# Patient Record
Sex: Female | Born: 1980 | Race: White | Hispanic: No | Marital: Married | Smoking: Never smoker
Health system: Southern US, Community
[De-identification: ages and names within clinical notes are randomized; demographics above are authoritative.]

## PROBLEM LIST (undated history)

## (undated) DIAGNOSIS — K589 Irritable bowel syndrome without diarrhea: Secondary | ICD-10-CM

## (undated) DIAGNOSIS — F419 Anxiety disorder, unspecified: Secondary | ICD-10-CM

## (undated) DIAGNOSIS — T7840XA Allergy, unspecified, initial encounter: Secondary | ICD-10-CM

## (undated) DIAGNOSIS — K219 Gastro-esophageal reflux disease without esophagitis: Secondary | ICD-10-CM

## (undated) DIAGNOSIS — F32A Depression, unspecified: Secondary | ICD-10-CM

## (undated) DIAGNOSIS — D649 Anemia, unspecified: Secondary | ICD-10-CM

## (undated) DIAGNOSIS — J45909 Unspecified asthma, uncomplicated: Secondary | ICD-10-CM

## (undated) DIAGNOSIS — F329 Major depressive disorder, single episode, unspecified: Secondary | ICD-10-CM

## (undated) DIAGNOSIS — Z8632 Personal history of gestational diabetes: Secondary | ICD-10-CM

## (undated) DIAGNOSIS — Z87440 Personal history of urinary (tract) infections: Secondary | ICD-10-CM

## (undated) DIAGNOSIS — B019 Varicella without complication: Secondary | ICD-10-CM

## (undated) HISTORY — DX: Varicella without complication: B01.9

## (undated) HISTORY — DX: Allergy, unspecified, initial encounter: T78.40XA

## (undated) HISTORY — DX: Irritable bowel syndrome, unspecified: K58.9

## (undated) HISTORY — DX: Gastro-esophageal reflux disease without esophagitis: K21.9

## (undated) HISTORY — DX: Personal history of urinary (tract) infections: Z87.440

## (undated) HISTORY — PX: UPPER GASTROINTESTINAL ENDOSCOPY: SHX188

## (undated) HISTORY — DX: Anemia, unspecified: D64.9

## (undated) HISTORY — DX: Unspecified asthma, uncomplicated: J45.909

## (undated) HISTORY — DX: Personal history of gestational diabetes: Z86.32

## (undated) HISTORY — DX: Depression, unspecified: F32.A

## (undated) HISTORY — DX: Anxiety disorder, unspecified: F41.9

## (undated) HISTORY — PX: COLONOSCOPY: SHX174

## (undated) HISTORY — DX: Major depressive disorder, single episode, unspecified: F32.9

---
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
## 2009-05-19 ENCOUNTER — Emergency Department (HOSPITAL_COMMUNITY): Admission: EM | Admit: 2009-05-19 | Discharge: 2009-05-19 | Payer: Self-pay | Admitting: Emergency Medicine

## 2011-06-07 ENCOUNTER — Ambulatory Visit (INDEPENDENT_AMBULATORY_CARE_PROVIDER_SITE_OTHER): Payer: BC Managed Care – PPO | Admitting: Family

## 2011-06-07 ENCOUNTER — Encounter: Payer: Self-pay | Admitting: Family

## 2011-06-07 DIAGNOSIS — R05 Cough: Secondary | ICD-10-CM

## 2011-06-07 DIAGNOSIS — J309 Allergic rhinitis, unspecified: Secondary | ICD-10-CM

## 2011-06-07 DIAGNOSIS — J45909 Unspecified asthma, uncomplicated: Secondary | ICD-10-CM

## 2011-06-07 DIAGNOSIS — R059 Cough, unspecified: Secondary | ICD-10-CM

## 2011-06-07 MED ORDER — MONTELUKAST SODIUM 10 MG PO TABS: 10.0000 mg | ORAL_TABLET | Freq: Every day | ORAL | Status: DC

## 2011-06-07 MED ORDER — ALBUTEROL SULFATE (2.5 MG/3ML) 0.083% IN NEBU: 2.5000 mg | INHALATION_SOLUTION | Freq: Four times a day (QID) | RESPIRATORY_TRACT | Status: DC | PRN

## 2011-06-07 MED ORDER — PREDNISONE 20 MG PO TABS: ORAL_TABLET | ORAL | Status: AC

## 2011-06-07 NOTE — Patient Instructions (Signed)
Asthma Attack Prevention HOW CAN ASTHMA BE PREVENTED? Currently, there is no way to prevent asthma from starting. However, you can take steps to control the disease and prevent its symptoms after you have been diagnosed. Learn about your asthma and how to control it. Take an active role to control your asthma by working with your caregiver to create and follow an asthma action plan. An asthma action plan guides you in taking your medicines properly, avoiding factors that make your asthma worse, tracking your level of asthma control, responding to worsening asthma, and seeking emergency care when needed. To track your asthma, keep records of your symptoms, check your peak flow number using a peak flow meter (handheld device that shows how well air moves out of your lungs), and get regular asthma checkups.  Other ways to prevent asthma attacks include:  Use medicines as your caregiver directs.   Identify and avoid things that make your asthma worse (as much as you can).   Keep track of your asthma symptoms and level of control.   Get regular checkups for your asthma.   With your caregiver, write a detailed plan for taking medicines and managing an asthma attack. Then be sure to follow your action plan. Asthma is an ongoing condition that needs regular monitoring and treatment.   Identify and avoid asthma triggers. A number of outdoor allergens and irritants (pollen, mold, cold air, air pollution) can trigger asthma attacks. Find out what causes or makes your asthma worse, and take steps to avoid those triggers (see below).   Monitor your breathing. Learn to recognize warning signs of an attack, such as slight coughing, wheezing or shortness of breath. However, your lung function may already decrease before you notice any signs or symptoms, so regularly measure and record your peak airflow with a home peak flow meter.   Identify and treat attacks early. If you act quickly, you're less likely to have  a severe attack. You will also need less medicine to control your symptoms. When your peak flow measurements decrease and alert you to an upcoming attack, take your medicine as instructed, and immediately stop any activity that may have triggered the attack. If your symptoms do not improve, get medical help.   Pay attention to increasing quick-relief inhaler use. If you find yourself relying on your quick-relief inhaler (such as albuterol), your asthma is not under control. See your caregiver about adjusting your treatment.  IDENTIFY AND CONTROL FACTORS THAT MAKE YOUR ASTHMA WORSE A number of common things can set off or make your asthma symptoms worse (asthma triggers). Keep track of your asthma symptoms for several weeks, detailing all the environmental and emotional factors that are linked with your asthma. When you have an asthma attack, go back to your asthma diary to see which factor, or combination of factors, might have contributed to it. Once you know what these factors are, you can take steps to control many of them.  Allergies: If you have allergies and asthma, it is important to take asthma prevention steps at home. Asthma attacks (worsening of asthma symptoms) can be triggered by allergies, which can cause temporary increased inflammation of your airways. Minimizing contact with the substance to which you are allergic will help prevent an asthma attack. Animal Dander:   Some people are allergic to the flakes of skin or dried saliva from animals with fur or feathers. Keep these pets out of your home.   If you can't keep a pet outdoors, keep the   pet out of your bedroom and other sleeping areas at all times, and keep the door closed.   Remove carpets and furniture covered with cloth from your home. If that is not possible, keep the pet away from fabric-covered furniture and carpets.  Dust Mites:  Many people with asthma are allergic to dust mites. Dust mites are tiny bugs that are found in  every home, in mattresses, pillows, carpets, fabric-covered furniture, bedcovers, clothes, stuffed toys, fabric, and other fabric-covered items.   Cover your mattress in a special dust-proof cover.   Cover your pillow in a special dust-proof cover, or wash the pillow each week in hot water. Water must be hotter than 130 F to kill dust mites. Cold or warm water used with detergent and bleach can also be effective.   Wash the sheets and blankets on your bed each week in hot water.   Try not to sleep or lie on cloth-covered cushions.   Call ahead when traveling and ask for a smoke-free hotel room. Bring your own bedding and pillows, in case the hotel only supplies feather pillows and down comforters, which may contain dust mites and cause asthma symptoms.   Remove carpets from your bedroom and those laid on concrete, if you can.   Keep stuffed toys out of the bed, or wash the toys weekly in hot water or cooler water with detergent and bleach.  Cockroaches:  Many people with asthma are allergic to the droppings and remains of cockroaches.   Keep food and garbage in closed containers. Never leave food out.   Use poison baits, traps, powders, gels, or paste (for example, boric acid).   If a spray is used to kill cockroaches, stay out of the room until the odor goes away.  Indoor Mold:  Fix leaky faucets, pipes, or other sources of water that have mold around them.   Clean moldy surfaces with a cleaner that has bleach in it.  Pollen and Outdoor Mold:  When pollen or mold spore counts are high, try to keep your windows closed.   Stay indoors with windows closed from late morning to afternoon, if you can. Pollen and some mold spore counts are highest at that time.   Ask your caregiver whether you need to take or increase anti-inflammatory medicine before your allergy season starts.  Irritants:   Tobacco smoke is an irritant. If you smoke, ask your caregiver how you can quit. Ask family  members to quit smoking, too. Do not allow smoking in your home or car.   If possible, do not use a wood-burning stove, kerosene heater, or fireplace. Minimize exposure to all sources of smoke, including incense, candles, fires, and fireworks.   Try to stay away from strong odors and sprays, such as perfume, talcum powder, hair spray, and paints.   Decrease humidity in your home and use an indoor air cleaning device. Reduce indoor humidity to below 60 percent. Dehumidifiers or central air conditioners can do this.   Try to have someone else vacuum for you once or twice a week, if you can. Stay out of rooms while they are being vacuumed and for a short while afterward.   If you vacuum, use a dust mask from a hardware store, a double-layered or microfilter vacuum cleaner bag, or a vacuum cleaner with a HEPA filter.   Sulfites in foods and beverages can be irritants. Do not drink beer or wine, or eat dried fruit, processed potatoes, or shrimp if they cause asthma   symptoms.   Cold air can trigger an asthma attack. Cover your nose and mouth with a scarf on cold or windy days.   Several health conditions can make asthma more difficult to manage, including runny nose, sinus infections, reflux disease, psychological stress, and sleep apnea. Your caregiver will treat these conditions, as well.   Avoid close contact with people who have a cold or the flu, since your asthma symptoms may get worse if you catch the infection from them. Wash your hands thoroughly after touching items that may have been handled by people with a respiratory infection.   Get a flu shot every year to protect against the flu virus, which often makes asthma worse for days or weeks. Also get a pneumonia shot once every five to 10 years.  Drugs:  Aspirin and other painkillers can cause asthma attacks. 10% to 20% of people with asthma have sensitivity to aspirin or a group of painkillers called non-steroidal anti-inflammatory drugs  (NSAIDS), such as ibuprofen and naproxen. These drugs are used to treat pain and reduce fevers. Asthma attacks caused by any of these medicines can be severe and even fatal. These drugs must be avoided in people who have known aspirin sensitive asthma. Products with acetaminophen are considered safe for people who have asthma. It is important that people with aspirin sensitivity read labels of all over-the-counter drugs used to treat pain, colds, coughs, and fever.   Beta blockers and ACE inhibitors are other drugs which you should discuss with your caregiver, in relation to your asthma.  ALLERGY SKIN TESTING  Ask your asthma caregiver about allergy skin testing or blood testing (RAST test) to identify the allergens to which you are sensitive. If you are found to have allergies, allergy shots (immunotherapy) for asthma may help prevent future allergies and asthma. With allergy shots, small doses of allergens (substances to which you are allergic) are injected under your skin on a regular schedule. Over a period of time, your body may become used to the allergen and less responsive with asthma symptoms. You can also take measures to minimize your exposure to those allergens. EXERCISE  If you have exercise-induced asthma, or are planning vigorous exercise, or exercise in cold, humid, or dry environments, prevent exercise-induced asthma by following your caregiver's advice regarding asthma treatment before exercising. Document Released: 12/27/2008 Document Revised: 12/28/2010 Document Reviewed: 12/27/2008 [employer] Patient Information 2012 [employer]. 

## 2011-06-07 NOTE — Progress Notes (Signed)
  Subjective:    Patient ID: Anne Humphrey, female    DOB: 12/24/1980, 31 y.o.   MRN: [id number]  Asthma She complains of cough, shortness of breath and wheezing. This is a recurrent problem. The current episode started yesterday. The cough is productive of sputum and productive. Associated symptoms include postnasal drip, rhinorrhea and sneezing. Her symptoms are aggravated by pollen and URI. Her symptoms are alleviated by nothing. Ineffective treatments: Out of asthma meds. Risk factors for lung disease include no known risk factors. Her past medical history is significant for asthma.      Review of Systems  Constitutional: Negative.   HENT: Positive for congestion, rhinorrhea, sneezing and postnasal drip.   Eyes: Negative.   Respiratory: Positive for cough, shortness of breath and wheezing.   Cardiovascular: Negative.   Musculoskeletal: Negative.   Skin: Negative.   Neurological: Negative.   Hematological: Negative.   Psychiatric/Behavioral: Negative.        Objective:   Physical Exam  Constitutional: She is oriented to person, place, and time. She appears well-developed and well-nourished.  HENT:  Right Ear: External ear normal.  Left Ear: External ear normal.  Nose: Nose normal.  Mouth/Throat: Oropharynx is clear and moist.  Neck: Normal range of motion. Neck supple.  Cardiovascular: Normal rate, regular rhythm and normal heart sounds.   Pulmonary/Chest: She has wheezes.  Neurological: She is alert and oriented to person, place, and time.  Skin: Skin is warm and dry.  Psychiatric: She has a normal mood and affect.          Assessment & Plan:  Assessment: Asthma Exacerbation, Cough  Plan: Singulair 10mg  QD, Proventil 2 puffs q 4-6 hours, Prednisone 60 x 3, 40 x 3, 20mg  x3. Call the office if symptoms worsen or persist. Scheduled CPX.

## 2011-06-08 ENCOUNTER — Telehealth: Payer: Self-pay

## 2011-06-08 MED ORDER — ALBUTEROL SULFATE HFA 108 (90 BASE) MCG/ACT IN AERS: 2.0000 | INHALATION_SPRAY | Freq: Four times a day (QID) | RESPIRATORY_TRACT | Status: DC | PRN

## 2011-06-08 NOTE — Telephone Encounter (Signed)
Rx sent 

## 2011-06-08 NOTE — Telephone Encounter (Signed)
Patient called stating that she was seen yesterday and a rx for a rescue inhaler was to be sent to the pharmacy. Patient would like rx sent to [facility] [address]  Please advise

## 2011-06-20 ENCOUNTER — Other Ambulatory Visit (INDEPENDENT_AMBULATORY_CARE_PROVIDER_SITE_OTHER): Payer: BC Managed Care – PPO

## 2011-06-20 ENCOUNTER — Other Ambulatory Visit: Payer: Self-pay

## 2011-06-20 ENCOUNTER — Other Ambulatory Visit: Payer: BC Managed Care – PPO

## 2011-06-20 DIAGNOSIS — Z0000 Encounter for general adult medical examination without abnormal findings: Secondary | ICD-10-CM

## 2011-06-20 LAB — TSH: TSH: 0.94 u[IU]/mL (ref 0.35–5.50)

## 2011-06-20 LAB — LIPID PANEL
Cholesterol: 178 mg/dL (ref 0–200)
LDL Cholesterol: 107 mg/dL — ABNORMAL HIGH (ref 0–99)
VLDL: 9.6 mg/dL (ref 0.0–40.0)

## 2011-06-20 LAB — CBC
Hemoglobin: 13.4 g/dL (ref 12.0–15.0)
MCHC: 33.2 g/dL (ref 30.0–36.0)
RDW: 12.9 % (ref 11.5–14.6)

## 2011-06-20 LAB — BASIC METABOLIC PANEL
Chloride: 107 mEq/L (ref 96–112)
Creatinine, Ser: 0.6 mg/dL (ref 0.4–1.2)
Potassium: 4 mEq/L (ref 3.5–5.1)

## 2011-06-25 ENCOUNTER — Encounter: Payer: BC Managed Care – PPO | Admitting: Family

## 2011-06-27 ENCOUNTER — Encounter: Payer: Self-pay | Admitting: Family

## 2011-06-27 ENCOUNTER — Ambulatory Visit (INDEPENDENT_AMBULATORY_CARE_PROVIDER_SITE_OTHER): Payer: BC Managed Care – PPO | Admitting: Family

## 2011-06-27 VITALS — BP 112/70 | Temp 98.6°F | Ht 64.0 in | Wt 127.0 lb

## 2011-06-27 DIAGNOSIS — Z0000 Encounter for general adult medical examination without abnormal findings: Secondary | ICD-10-CM

## 2011-06-27 DIAGNOSIS — Z23 Encounter for immunization: Secondary | ICD-10-CM

## 2011-06-27 NOTE — Progress Notes (Signed)
Subjective:    Patient ID: Anne Humphrey, female    DOB: [birth year], 31 y.o.   MRN: [id number]  HPI  This is a routine physical examination for this healthy  Female. Reviewed all health maintenance protocols including mammography colonoscopy bone density and reviewed appropriate screening labs. Her immunization history was reviewed as well as her current medications and allergies refills of her chronic medications were given and the plan for yearly health maintenance was discussed all orders and referrals were made as appropriate.   Review of Systems  Constitutional: Negative.   HENT: Negative.   Eyes: Negative.   Respiratory: Negative.   Cardiovascular: Negative.   Gastrointestinal: Negative.   Genitourinary: Negative.   Musculoskeletal: Negative.   Skin: Negative.   Neurological: Negative.   Hematological: Negative.   Psychiatric/Behavioral: Negative.    Past Medical History  Diagnosis Date  . Allergy   . Asthma   . GERD (gastroesophageal reflux disease)     History   Social History  . Marital Status: Married    Spouse Name: N/A    Number of Children: N/A  . Years of Education: N/A   Occupational History  . Not on file.   Social History Main Topics  . Smoking status: Never Smoker   . Smokeless tobacco: Not on file  . Alcohol Use: Yes     rarely  . Drug Use: No  . Sexually Active:    Other Topics Concern  . Not on file   Social History Narrative  . No narrative on file    Past Surgical History  Procedure Date  . Neck mass excision 1989    Family History  Problem Relation Age of Onset  . Leukemia Maternal Grandmother   . Heart disease Maternal Grandfather   . Diabetes Maternal Grandfather     Not on File  Current Outpatient Prescriptions on File Prior to Visit  Medication Sig Dispense Refill  . albuterol (PROVENTIL HFA;VENTOLIN HFA) 108 (90 BASE) MCG/ACT inhaler Inhale 2 puffs into the lungs every 6 (six) hours as needed for wheezing.  1 Inhaler   1  . albuterol (PROVENTIL) (2.5 MG/3ML) 0.083% nebulizer solution Take 3 mLs (2.5 mg total) by nebulization every 6 (six) hours as needed for wheezing.  150 mL  1  . montelukast (SINGULAIR) 10 MG tablet Take 1 tablet (10 mg total) by mouth at bedtime.  30 tablet  3    BP 112/70  Temp(Src) 98.6 F (37 C) (Oral)  Ht 5\' 4"  (1.626 m)  Wt 127 lb (57.607 kg)  BMI 21.80 kg/m2  LMP 04/28/2013chart    Objective:   Physical Exam  Constitutional: She is oriented to person, place, and time. She appears well-developed and well-nourished.  HENT:  Head: Normocephalic and atraumatic.  Right Ear: External ear normal.  Left Ear: External ear normal.  Nose: Nose normal.  Mouth/Throat: Oropharynx is clear and moist.  Eyes: Conjunctivae and EOM are normal. Pupils are equal, round, and reactive to light.  Neck: Normal range of motion. Neck supple. No thyromegaly present.  Cardiovascular: Normal rate, regular rhythm, normal heart sounds and intact distal pulses.   Pulmonary/Chest: Effort normal and breath sounds normal.  Abdominal: Soft. Bowel sounds are normal.  Genitourinary:       Deferred at patient's request  Musculoskeletal: Normal range of motion.  Neurological: She is alert and oriented to person, place, and time. She has normal reflexes.  Skin: Skin is warm and dry.  Psychiatric: She has a normal mood and  affect.          Assessment & Plan:  Assessment: Complete physical exam  Plan: Patient will return for Pap smear at a later date. Labs were discussed today. Encouraged healthy diet and exercise, self breast exams. She Administered. Continue current medications.

## 2011-06-27 NOTE — Patient Instructions (Signed)
Exercise to Stay Healthy  Exercise helps you become and stay healthy.    EXERCISE IDEAS AND TIPS  Choose exercises that:   You enjoy.   Fit into your day.  You do not need to exercise really hard to be healthy. You can do exercises at a slow or medium level and stay healthy. You can:   Stretch before and after working out.   Try yoga, Pilates, or tai chi.   Lift weights.   Walk fast, swim, jog, run, climb stairs, bicycle, dance, or rollerskate.   Take aerobic classes.    Exercises that burn about 150 calories:     Running 1  miles in 15 minutes.   Playing volleyball for 45 to 60 minutes.   Washing and waxing a car for 45 to 60 minutes.   Playing touch football for 45 minutes.   Walking 1  miles in 35 minutes.   Pushing a stroller 1  miles in 30 minutes.   Playing basketball for 30 minutes.   Raking leaves for 30 minutes.   Bicycling 5 miles in 30 minutes.   Walking 2 miles in 30 minutes.   Dancing for 30 minutes.   Shoveling snow for 15 minutes.   Swimming laps for 20 minutes.   Walking up stairs for 15 minutes.   Bicycling 4 miles in 15 minutes.   Gardening for 30 to 45 minutes.   Jumping rope for 15 minutes.   Washing windows or floors for 45 to 60 minutes.  Document Released: 02/10/2010 Document Revised: 12/28/2010 Document Reviewed: 02/10/2010  [employer] Patient Information 2012 [employer].

## 2012-01-23 NOTE — L&D Delivery Note (Addendum)
Delivery Note At 9:52 AM a viable and healthy female was delivered via  (Presentation: Direct; Occiput Anterior ).  APGAR: , ; weight Pending.   Placenta status: Spontaneous, Intact.  Cord:  with a subjectively short umbilical cord  Pt presented to L&D with painful contractions. She progressed quickly to a bulging bag.  Amniotomy was performed for thin meconium fluid. Quickly after amniotomy the patient delivered the infants head. With delivery of the head she involuntarily was closing her legs precluding immediate delivery of the shoulders. Within 30-60 seconds of delivery of the head the shoulders were delivered. No Shoulder dystocia interventions were required. The infant was placed in the bed between the maternal legs due to the short length of the umbilical cord. The cord was clamped and cut and the infant was passed to the nurse and the isolette.  The placenta was delivered spontaneously, intact, and will be sent to pathology.  No lacerations required repair.  After delivery, the infant has some syndromic facial features.  Pt declined genetic screeing during pregnancy. There NBN is aware. The infant was transferred to the NBN due to tachypnea and low oxygen saturations.  Anesthesia:  None Episiotomy: None Lacerations: None Suture Repair: None Est. Blood Loss (mL): 250cc  Mom to postpartum.  Baby to nursery-stable.  Hamlin Devine H. 08/30/2012, 10:22 AM

## 2012-04-15 LAB — OB RESULTS CONSOLE HIV ANTIBODY (ROUTINE TESTING): HIV: NONREACTIVE

## 2012-04-15 LAB — OB RESULTS CONSOLE GC/CHLAMYDIA: Chlamydia: NEGATIVE

## 2012-06-03 LAB — OB RESULTS CONSOLE HEPATITIS B SURFACE ANTIGEN: Hepatitis B Surface Ag: NEGATIVE

## 2012-06-04 LAB — OB RESULTS CONSOLE RUBELLA ANTIBODY, IGM: Rubella: IMMUNE

## 2012-06-04 LAB — OB RESULTS CONSOLE ANTIBODY SCREEN: Antibody Screen: NEGATIVE

## 2012-06-04 LAB — OB RESULTS CONSOLE ABO/RH

## 2012-08-30 ENCOUNTER — Encounter (HOSPITAL_COMMUNITY): Payer: Self-pay | Admitting: *Deleted

## 2012-08-30 ENCOUNTER — Inpatient Hospital Stay (HOSPITAL_COMMUNITY)
Admission: AD | Admit: 2012-08-30 | Discharge: 2012-09-01 | DRG: 775 | Disposition: A | Discharge status: Home / Self Care | Payer: Medicaid Other | Source: Ambulatory Visit | Attending: Obstetrics and Gynecology | Admitting: Obstetrics and Gynecology

## 2012-08-30 ENCOUNTER — Encounter (HOSPITAL_COMMUNITY): Payer: Self-pay | Admitting: Anesthesiology

## 2012-08-30 ENCOUNTER — Inpatient Hospital Stay (HOSPITAL_COMMUNITY): Payer: Medicaid Other | Admitting: Anesthesiology

## 2012-08-30 LAB — CBC
MCH: 25.6 pg — ABNORMAL LOW (ref 26.0–34.0)
MCHC: 33.1 g/dL (ref 30.0–36.0)
MCV: 77.4 fL — ABNORMAL LOW (ref 78.0–100.0)
Platelets: 233 10*3/uL (ref 150–400)
RBC: 4.02 MIL/uL (ref 3.87–5.11)

## 2012-08-30 MED ORDER — LACTATED RINGERS IV SOLN: 500.0000 mL | Freq: Once | INTRAVENOUS | Status: DC

## 2012-08-30 MED ORDER — WITCH HAZEL-GLYCERIN EX PADS
1.0000 "application " | MEDICATED_PAD | CUTANEOUS | Status: DC | PRN
  Administered 2012-08-30: 1 via TOPICAL

## 2012-08-30 MED ORDER — ONDANSETRON HCL 4 MG/2ML IJ SOLN: 4.0000 mg | INTRAMUSCULAR | Status: DC | PRN

## 2012-08-30 MED ORDER — BENZOCAINE-MENTHOL 20-0.5 % EX AERO
1.0000 "application " | INHALATION_SPRAY | CUTANEOUS | Status: DC | PRN
  Administered 2012-08-30 – 2012-08-31 (×2): 1 via TOPICAL
  Filled 2012-08-30 (×2): qty 56

## 2012-08-30 MED ORDER — OXYTOCIN 40 UNITS IN LACTATED RINGERS INFUSION - SIMPLE MED: 62.5000 mL/h | INTRAVENOUS | Status: DC

## 2012-08-30 MED ORDER — EPHEDRINE 5 MG/ML INJ
10.0000 mg | INTRAVENOUS | Status: DC | PRN
  Filled 2012-08-30: qty 2

## 2012-08-30 MED ORDER — IBUPROFEN 600 MG PO TABS
600.0000 mg | ORAL_TABLET | Freq: Four times a day (QID) | ORAL | Status: DC
  Administered 2012-08-30 – 2012-09-01 (×9): 600 mg via ORAL
  Filled 2012-08-30 (×9): qty 1

## 2012-08-30 MED ORDER — ALBUTEROL SULFATE HFA 108 (90 BASE) MCG/ACT IN AERS: 2.0000 | INHALATION_SPRAY | Freq: Four times a day (QID) | RESPIRATORY_TRACT | Status: DC | PRN

## 2012-08-30 MED ORDER — PNEUMOCOCCAL VAC POLYVALENT 25 MCG/0.5ML IJ INJ
0.5000 mL | INJECTION | INTRAMUSCULAR | Status: AC
  Administered 2012-08-31: 0.5 mL via INTRAMUSCULAR
  Filled 2012-08-30: qty 0.5

## 2012-08-30 MED ORDER — OXYTOCIN BOLUS FROM INFUSION: 500.0000 mL | INTRAVENOUS | Status: DC

## 2012-08-30 MED ORDER — DIPHENHYDRAMINE HCL 50 MG/ML IJ SOLN: 12.5000 mg | INTRAMUSCULAR | Status: DC | PRN

## 2012-08-30 MED ORDER — OXYCODONE-ACETAMINOPHEN 5-325 MG PO TABS
1.0000 | ORAL_TABLET | ORAL | Status: DC | PRN
  Administered 2012-08-30 – 2012-08-31 (×3): 1 via ORAL
  Filled 2012-08-30 (×3): qty 1

## 2012-08-30 MED ORDER — PHENYLEPHRINE 40 MCG/ML (10ML) SYRINGE FOR IV PUSH (FOR BLOOD PRESSURE SUPPORT)
80.0000 ug | PREFILLED_SYRINGE | INTRAVENOUS | Status: DC | PRN
  Filled 2012-08-30: qty 2

## 2012-08-30 MED ORDER — OXYTOCIN 40 UNITS IN LACTATED RINGERS INFUSION - SIMPLE MED
INTRAVENOUS | Status: AC
  Filled 2012-08-30: qty 1000

## 2012-08-30 MED ORDER — EPHEDRINE 5 MG/ML INJ
10.0000 mg | INTRAVENOUS | Status: DC | PRN
  Filled 2012-08-30: qty 2
  Filled 2012-08-30: qty 4

## 2012-08-30 MED ORDER — LACTATED RINGERS IV SOLN: 500.0000 mL | INTRAVENOUS | Status: DC | PRN

## 2012-08-30 MED ORDER — IBUPROFEN 600 MG PO TABS: 600.0000 mg | ORAL_TABLET | Freq: Four times a day (QID) | ORAL | Status: DC | PRN

## 2012-08-30 MED ORDER — FLEET ENEMA 7-19 GM/118ML RE ENEM: 1.0000 | ENEMA | RECTAL | Status: DC | PRN

## 2012-08-30 MED ORDER — LACTATED RINGERS IV SOLN: INTRAVENOUS | Status: DC

## 2012-08-30 MED ORDER — METHYLERGONOVINE MALEATE 0.2 MG/ML IJ SOLN: 0.2000 mg | INTRAMUSCULAR | Status: DC | PRN

## 2012-08-30 MED ORDER — LANOLIN HYDROUS EX OINT: TOPICAL_OINTMENT | CUTANEOUS | Status: DC | PRN

## 2012-08-30 MED ORDER — PRENATAL MULTIVITAMIN CH
1.0000 | ORAL_TABLET | Freq: Every day | ORAL | Status: DC
  Administered 2012-08-30 – 2012-09-01 (×3): 1 via ORAL
  Filled 2012-08-30 (×3): qty 1

## 2012-08-30 MED ORDER — ZOLPIDEM TARTRATE 5 MG PO TABS: 5.0000 mg | ORAL_TABLET | Freq: Every evening | ORAL | Status: DC | PRN

## 2012-08-30 MED ORDER — SIMETHICONE 80 MG PO CHEW: 80.0000 mg | CHEWABLE_TABLET | ORAL | Status: DC | PRN

## 2012-08-30 MED ORDER — SENNOSIDES-DOCUSATE SODIUM 8.6-50 MG PO TABS
2.0000 | ORAL_TABLET | Freq: Every day | ORAL | Status: DC
  Administered 2012-08-30 – 2012-08-31 (×2): 2 via ORAL

## 2012-08-30 MED ORDER — ACETAMINOPHEN 325 MG PO TABS: 650.0000 mg | ORAL_TABLET | ORAL | Status: DC | PRN

## 2012-08-30 MED ORDER — METHYLERGONOVINE MALEATE 0.2 MG PO TABS: 0.2000 mg | ORAL_TABLET | ORAL | Status: DC | PRN

## 2012-08-30 MED ORDER — PHENYLEPHRINE 40 MCG/ML (10ML) SYRINGE FOR IV PUSH (FOR BLOOD PRESSURE SUPPORT)
80.0000 ug | PREFILLED_SYRINGE | INTRAVENOUS | Status: DC | PRN
  Filled 2012-08-30: qty 5
  Filled 2012-08-30: qty 2

## 2012-08-30 MED ORDER — FENTANYL 2.5 MCG/ML BUPIVACAINE 1/10 % EPIDURAL INFUSION (WH - ANES)
14.0000 mL/h | INTRAMUSCULAR | Status: DC | PRN
  Filled 2012-08-30: qty 125

## 2012-08-30 MED ORDER — TETANUS-DIPHTH-ACELL PERTUSSIS 5-2.5-18.5 LF-MCG/0.5 IM SUSP
0.5000 mL | Freq: Once | INTRAMUSCULAR | Status: AC
  Administered 2012-08-30: 0.5 mL via INTRAMUSCULAR

## 2012-08-30 MED ORDER — LIDOCAINE HCL (PF) 1 % IJ SOLN
30.0000 mL | INTRAMUSCULAR | Status: DC | PRN
  Filled 2012-08-30: qty 30

## 2012-08-30 MED ORDER — CITRIC ACID-SODIUM CITRATE 334-500 MG/5ML PO SOLN: 30.0000 mL | ORAL | Status: DC | PRN

## 2012-08-30 MED ORDER — DIPHENHYDRAMINE HCL 25 MG PO CAPS: 25.0000 mg | ORAL_CAPSULE | Freq: Four times a day (QID) | ORAL | Status: DC | PRN

## 2012-08-30 MED ORDER — LIDOCAINE HCL (PF) 1 % IJ SOLN
INTRAMUSCULAR | Status: AC
  Filled 2012-08-30: qty 30

## 2012-08-30 MED ORDER — ONDANSETRON HCL 4 MG/2ML IJ SOLN: 4.0000 mg | Freq: Four times a day (QID) | INTRAMUSCULAR | Status: DC | PRN

## 2012-08-30 MED ORDER — OXYCODONE-ACETAMINOPHEN 5-325 MG PO TABS: 1.0000 | ORAL_TABLET | ORAL | Status: DC | PRN

## 2012-08-30 MED ORDER — DIBUCAINE 1 % RE OINT
1.0000 "application " | TOPICAL_OINTMENT | RECTAL | Status: DC | PRN
  Administered 2012-08-30: 1 via RECTAL
  Filled 2012-08-30: qty 28

## 2012-08-30 MED ORDER — ONDANSETRON HCL 4 MG PO TABS: 4.0000 mg | ORAL_TABLET | ORAL | Status: DC | PRN

## 2012-08-30 NOTE — H&P (Signed)
Anne Humphrey is a 32 y.o. female presenting for labor  32 yo G3P2002 presents at 37+0 with painful contractions and was found to be 7cm dilated and she was admitted for labor. Pt started prenatal care in our office at 17 weeks. Her pregnancy has been uncomplicated to this point History OB History   Grav Para Term Preterm Abortions TAB SAB Ect Mult Living   3 2             Past Medical History  Diagnosis Date  . Allergy   . Asthma   . GERD (gastroesophageal reflux disease)    Past Surgical History  Procedure Laterality Date  . Neck mass excision  1989   Family History: family history includes Diabetes in her maternal grandfather; Heart disease in her maternal grandfather; and Leukemia in her maternal grandmother. Social History:  reports that she has never smoked. She has never used smokeless tobacco. She reports that  drinks alcohol. She reports that she does not use illicit drugs.   Prenatal Transfer Tool  Maternal Diabetes: No Elevated 1 hr gtt, 3 hr WNL Genetic Screening: Declined, Declined Quad Maternal Ultrasounds/Referrals: Normal Fetal Ultrasounds or other Referrals:  None Maternal Substance Abuse:  No Significant Maternal Medications:  None Significant Maternal Lab Results:  None Other Comments:  None  ROS  Dilation: 10 Effacement (%): 100 Station: +3 Exam by:: Cordaryl Decelles Blood pressure 132/84, pulse 71, temperature 98.6 F (37 C), temperature source Oral, resp. rate 20, height 5\' 4"  (1.626 m), weight 72.576 kg (160 lb), last menstrual period 12/15/2011. Exam Physical Exam  Prenatal labs: ABO, Rh: A/Positive/-- (05/14 0000) Antibody: Negative (05/14 0000) Rubella: Immune (05/14 0000) RPR: Nonreactive (04/08 0000)  HBsAg: Negative (05/13 0000)  HIV: Non-reactive (05/13 0000)  GBS: Negative (07/07 0000)   Assessment/Plan: 1) Admit 2) Anticipate SVD   Anne Humphrey H. 08/30/2012, 10:19 AM

## 2012-08-30 NOTE — MAU Note (Signed)
PT presents with complaints of contractions that started throughout the night and have gotten worse this morning. States contractions are now 2-3 mins apart. Denies any leakage of fluid or bleeding

## 2012-08-30 NOTE — Anesthesia Preprocedure Evaluation (Deleted)
Anesthesia Evaluation  Patient identified by MRN, date of birth, ID band Patient awake    Reviewed: Allergy & Precautions, H&P , NPO status , Patient's Chart, lab work & pertinent test results  Airway Mallampati: II TM Distance: >3 FB Neck ROM: Full    Dental  (+) Dental Advisory Given   Pulmonary asthma ,  breath sounds clear to auscultation        Cardiovascular negative cardio ROS  Rhythm:Regular Rate:Normal     Neuro/Psych negative neurological ROS  negative psych ROS   GI/Hepatic Neg liver ROS, GERD-  Medicated,  Endo/Other  negative endocrine ROS  Renal/GU negative Renal ROS     Musculoskeletal negative musculoskeletal ROS (+)   Abdominal   Peds  Hematology negative hematology ROS (+)   Anesthesia Other Findings   Reproductive/Obstetrics (+) Pregnancy                           Anesthesia Physical Anesthesia Plan  ASA: II  Anesthesia Plan: Epidural   Post-op Pain Management:    Induction:   Airway Management Planned:   Additional Equipment:   Intra-op Plan:   Post-operative Plan:   Informed Consent: I have reviewed the patients History and Physical, chart, labs and discussed the procedure including the risks, benefits and alternatives for the proposed anesthesia with the patient or authorized representative who has indicated his/her understanding and acceptance.     Plan Discussed with:   Anesthesia Plan Comments:         Anesthesia Quick Evaluation

## 2012-08-31 LAB — CBC
MCV: 78.8 fL (ref 78.0–100.0)
Platelets: 203 10*3/uL (ref 150–400)
RBC: 3.21 MIL/uL — ABNORMAL LOW (ref 3.87–5.11)
WBC: 10.1 10*3/uL (ref 4.0–10.5)

## 2012-08-31 NOTE — Progress Notes (Signed)
Post Partum Day 1 Subjective: no complaints, up ad lib, voiding and tolerating PO  Objective: Blood pressure 100/64, pulse 74, temperature 97.8 F (36.6 C), temperature source Oral, resp. rate 18, height 5\' 4"  (1.626 m), weight 72.576 kg (160 lb), last menstrual period 12/15/2011, SpO2 98.00%, unknown if currently breastfeeding.  Physical Exam:  General: alert, cooperative and appears stated age Lochia: appropriate Uterine Fundus: firm    Recent Labs  08/30/12 0905 08/31/12 0640  HGB 10.3* 8.3*  HCT 31.1* 25.3*    Assessment/Plan: Breastfeeding Infant undergoing chromosomal evaluation for possible Down Syndrome. Had Echo performed yesterday and WNL. Parents are dealing well with the possible diagnosis at this point.   LOS: 1 day   Jameia Makris H. 08/31/2012, 11:18 AM

## 2012-08-31 NOTE — Lactation Note (Signed)
This note was copied from the chart of Anne Humphrey. Lactation Consultation Note   Follow up consult with this mom and baby. Mom had pumped 5 mls of colsotrum, and at this time, [name] woke up. i assited mom with latching baby , first in cross cradle and then in football. She finally latched well with a 20 nipple shield, with colostrum fed into shield with curved tip syringe. The baby was vigorously sucking at the breast after finishing the 5 mls, and mom was very happy. I instructed mom to pump every 3 hours, and to add hand expression. I told mom we can use an SNS set up for her to feed her EBM into the shield with her breast feeding.  Patient Name: Anne Humphrey ZOXWR'U Date: 08/31/2012 Reason for consult: Follow-up assessment   Maternal Data Formula Feeding for Exclusion: No Infant to breast within first hour of birth: No Breastfeeding delayed due to:: Infant status Has patient been taught Hand Expression?: Yes Does the patient have breastfeeding experience prior to this delivery?: Yes  Feeding Feeding Type: Breast Milk Length of feed: 0 min  LATCH Score/Interventions Latch: Grasps breast easily, tongue down, lips flanged, rhythmical sucking. (with 20 nipple shiled and colostrum placed in shiled. ) Intervention(s): Skin to skin Intervention(s): Assist with latch  Audible Swallowing: Spontaneous and intermittent  Type of Nipple: Everted at rest and after stimulation  Comfort (Breast/Nipple): Soft / non-tender     Hold (Positioning): Assistance needed to correctly position infant at breast and maintain latch. Intervention(s): Breastfeeding basics reviewed;Support Pillows;Position options;Skin to skin  LATCH Score: 9  Lactation Tools Discussed/Used Tools: Nipple Shields Nipple shield size: 20 (mom used shiled with her first baby.) WIC Program: No Pump Review: Setup, frequency, and cleaning;Milk Storage;Other (comment) (hand expression taucht) Initiated by:: c  Chaquetta Schlottman Date initiated:: 08/31/12   Consult Status Consult Status: Follow-up Date: 09/01/12 Follow-up type: In-patient    Alfred Levins 08/31/2012, 11:49 AM

## 2012-08-31 NOTE — Lactation Note (Signed)
This note was copied from the chart of Anne Darria Corvera. Lactation Consultation Note    Initial consult with this mom and baby, [redacted] week gestation, mec stained fluid, and r/o trisomy 21. Baby initially latched a suckled well, but has not fed in over 12 hours. Very sleepy. I helped mom hand express 2 mls of colostrum, and the baby spoon fed well, lapping with her tongue extended. I then starfed mom pumping in premie setting, and will again help her with ahdn expression. Mom prefers not using a bottle to feed for now, so I told her that would be fine. I will follow up with mom later today  Patient Name: Anne Humphrey WNUUV'O Date: 08/31/2012 Reason for consult: Initial assessment;Other (Comment) ([redacted] week gestation, possible trisomy 73)   Maternal Data Formula Feeding for Exclusion: No Infant to breast within first hour of birth: No Breastfeeding delayed due to:: Infant status Has patient been taught Hand Expression?: Yes Does the patient have breastfeeding experience prior to this delivery?: Yes  Feeding Feeding Type: Breast Milk Length of feed: 0 min  LATCH Score/Interventions Latch: Too sleepy or reluctant, no latch achieved, no sucking elicited. Intervention(s): Skin to skin Intervention(s): Adjust position;Assist with latch;Breast massage;Breast compression  Audible Swallowing: None  Type of Nipple: Everted at rest and after stimulation  Comfort (Breast/Nipple): Soft / non-tender     Hold (Positioning): Assistance needed to correctly position infant at breast and maintain latch. (showed mom how to support baby, bring to breast, cross cradl) Intervention(s): Breastfeeding basics reviewed;Support Pillows;Position options;Skin to skin  LATCH Score: 5  Lactation Tools Discussed/Used Tools: Pump WIC Program: No Pump Review: Setup, frequency, and cleaning;Milk Storage;Other (comment) (hand expression taucht) Initiated by:: Anne Humphrey Date initiated:: 08/31/12   Consult  Status Consult Status: Follow-up Date: 08/31/12 Follow-up type: In-patient    Alfred Levins 08/31/2012, 10:49 AM

## 2012-09-01 ENCOUNTER — Encounter (HOSPITAL_COMMUNITY)
Admission: RE | Admit: 2012-09-01 | Discharge: 2012-09-01 | Disposition: A | Discharge status: Home / Self Care | Payer: Medicaid Other | Source: Ambulatory Visit | Attending: Obstetrics and Gynecology | Admitting: Obstetrics and Gynecology

## 2012-09-01 DIAGNOSIS — O923 Agalactia: Secondary | ICD-10-CM | POA: Insufficient documentation

## 2012-09-01 NOTE — Discharge Summary (Signed)
Obstetric Discharge Summary Reason for Admission: onset of labor Prenatal Procedures: ultrasound Intrapartum Procedures: spontaneous vaginal delivery Postpartum Procedures: none Complications-Operative and Postpartum: none Hemoglobin  Date Value Range Status  08/31/2012 8.3* 12.0 - 15.0 g/dL Final     REPEATED TO VERIFY     DELTA CHECK NOTED     HCT  Date Value Range Status  08/31/2012 25.3* 36.0 - 46.0 % Final    Physical Exam:  General: alert Lochia: appropriate Uterine Fundus: firm  Discharge Diagnoses: Term Pregnancy-delivered  Discharge Information: Date: 09/01/2012 Activity: pelvic rest Diet: routine Medications: PNV and Ibuprofen Condition: improved Instructions: refer to practice specific booklet Discharge to: home Follow-up Information   Follow up with Almon Hercules., MD. Schedule an appointment as soon as possible for a visit in 4 weeks.   Contact information:   [address] [place] [address] [phone]       Newborn Data: Live born female  Birth Weight: 6 lb 10.2 oz (3011 g) APGAR: 7, 8  Home with mother.  Being followed for possible Down's syndrome - chromosomes are pending.  Chayla Shands D 09/01/2012, 10:31 AM

## 2012-10-02 ENCOUNTER — Encounter (HOSPITAL_COMMUNITY)
Admission: RE | Admit: 2012-10-02 | Discharge: 2012-10-02 | Disposition: A | Discharge status: Home / Self Care | Payer: Medicaid Other | Source: Ambulatory Visit | Attending: Obstetrics and Gynecology | Admitting: Obstetrics and Gynecology

## 2012-10-02 DIAGNOSIS — O923 Agalactia: Secondary | ICD-10-CM | POA: Insufficient documentation

## 2012-11-02 ENCOUNTER — Encounter (HOSPITAL_COMMUNITY)
Admission: RE | Admit: 2012-11-02 | Discharge: 2012-11-02 | Disposition: A | Discharge status: Home / Self Care | Payer: BC Managed Care – PPO | Source: Ambulatory Visit | Attending: Obstetrics and Gynecology | Admitting: Obstetrics and Gynecology

## 2012-11-02 DIAGNOSIS — O923 Agalactia: Secondary | ICD-10-CM | POA: Insufficient documentation

## 2012-11-27 ENCOUNTER — Ambulatory Visit (HOSPITAL_COMMUNITY)
Admission: RE | Admit: 2012-11-27 | Discharge: 2012-11-27 | Disposition: A | Discharge status: Home / Self Care | Payer: BC Managed Care – PPO | Source: Ambulatory Visit | Attending: Obstetrics and Gynecology | Admitting: Obstetrics and Gynecology

## 2012-11-27 NOTE — Lactation Note (Signed)
Infant Lactation Consultation Outpatient Visit Note  Patient Name: Anne Humphrey                                    ZOXW'R Name: Anne Humphrey Date of Birth: 1980-08-25                                             DOB: [birth year] Birth Weight:                                                              Birth weight: 6 lbs 10 oz Gestational Age at Delivery:                                       37 weeks Type of Delivery:  Vaginal                                          Weight today: 10 lbs 6.3 oz at 10 weeks of age  Breastfeeding History Frequency of Breastfeeding:  1-2 times a day Length of Feeding: 5 minutes Voids: QS Stools: QS  Supplementing / Method: Pumping:  Type of Pump:  Symphony double electric breast pump   Frequency:  Every 2 during day (9 times in 24 hrs)  Volume:  4-6 oz first pumping in am, approx 2 oz per pumping during day Supplementing:             Type:  Elecare (hypoallergenic formula) added to breast milk to total 2 oz every 2 hours  Comments: Anne Humphrey is a 62 week old baby that was born with Trisomy 21 at 37 weeks.  This is Anne Humphrey's 3rd child that she has breast fed.  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  She had an NG tube in place for her feedings, discharged home at 59 1/2 weeks of age, weighing 6 lbs. 13 oz.  Anne Humphrey has a ASD which is being monitor by a cardiologist.  Anne Humphrey has been pumping every 2 hrs during the day, and supplementing with Elecare formula about 2-6 oz per 24 hrs.  Anne Humphrey sleeps 6-8 hrs at night.  Anne Humphrey has a pediatrician, developmental pediatrician, occupational therapist, and a feeding specialist.  She had tried for 2 separate weeks to solely breast feed, but Anne Humphrey lost weight, so this routine of 2 oz every 2 hours was established.  Baby gaining 1 oz per day.  Anne Humphrey finds that Anne Humphrey can latch, but has difficulty staying on the breast as she comes on and off.  Anne Humphrey has chronic mild congestion of  upper nasopharynx, otherwise she looks well and active.  Anne Humphrey has tried the nipple shield, but was unsuccessful as baby didn't seem to like it.  She has tried the SNS, but Anne Humphrey didn't like it and it never aided in milk transfer.  Currently Anne Humphrey goes to the breast one to two times a  day, but only for 5 minutes before she receives her bottle.  She drinks her bottle in about 10 minutes (2 oz), using a Similac slow flow nipple.  She rarely acts hungry, and she wakes her for most feedings.  Anne Humphrey came today to assess how she and baby are doing with breast feeding as her goal would be to exclusively breast feed Anne Humphrey.  Anne Humphrey aware of Trisomy 23 lending itself to a baby with general lower muscle tone, which affects the ability to have suction for adequate milk transfer from the breast.  On digital exam, Anne Humphrey noted to have a high narrow palate.  With gentle stimulation around he mouth, and digital stimulation of the palate, Anne Humphrey would thrust her tongue forward.  When she did grasp the finger, it was a very loose seal, and little suction felt, her tongue stayed flattened under my finger.  It was noted that Anne Humphrey's tongue had thick white coating on rear of tongue.  Both nipples pink, but denies any burning, itching, or shooting pain.         Consultation Evaluation:  Initial Feeding Assessment: Pre-feed Weight:  4722 Post-feed Weight:  4722 Amount Transferred:  0 Comments:  Left breast in cross cradle hold, on and off for 10 mins.  Anne Humphrey needed guidance with using proper hand placement to not interfere with baby's deep latch, and using good support in a U hold of her breast.  She tended to hunch over Anne Humphrey during latch and feeding.  Encouraged her to bring baby to her, and make sure she was comfortable during the feeding, which can increase milk letdown frequency.  Recommended using alternating breast compression to increase milk transfer.  Breasts are soft, but milk easily expressed.  Nipples  pink, no trauma noted, and breast easy to compress for a deep latch.  Anne Humphrey can open her mouth widely, and flange her lips, but squirmy and comes on and off.  Showed Anne Humphrey how to uncurl her lower lip to improve seal on breast.  Better seal makes better suction for milk transfer.    Additional Feeding Assessment: Pre-feed Weight:  4722 Post-feed Weight: 4722 Amount Transferred: 0 Comments:  Right breast using 20 mm nipple shield 10 mins  Additional Feeding Assessment: Pre-feed Weight:  4722 Post-feed Weight:  4738 Amount Transferred: 16 ml Comments: 10 min on the left, without nipple shield.  Anne Humphrey did settle down and stay on the breast consistently for about 7-10 mins without popping on and off.  Total Breast milk Transferred this Visit: 16 ml Total Supplement Given: 0 (Anne Humphrey had her 2 oz bottle in the car)   Recommendations-  1- Continue pumping, but try to space out a few to 3 hrs, and add one at night if she can.  Information on M[place]a supplement given, and recipe for Lactation Cookies given. 2- Continue offering breast daily, before a bottle feeding, or after bottle feeding.  Skin to skin is preferred.  Recommend not to count feedings at the breast as intake, but rather a snack/dessert. 3- Call pediatrician regarding white coating on tongue.  Thrush can lead to popping on and off the breast.   4- Yeast treatment for nipples to prevent passing it back and forth - handout given.  Talked about diet, already cut dairy out, and taking probiotic.  To watch her sweets 5- Talk to OT about exercises to increase suck tone.  Talk to feeding specialist  6- Anne Humphrey to take care of herself, as Anne Humphrey is a breast milk fed  baby, just not entirely directly from the breast.  Praise all her efforts. 7- Continue coming to  Support Group!!   Follow-Up  To call at any time she needs help   Anne Humphrey 11/27/2012, 10:27 AM

## 2012-12-03 ENCOUNTER — Encounter (HOSPITAL_COMMUNITY)
Admission: RE | Admit: 2012-12-03 | Discharge: 2012-12-03 | Disposition: A | Discharge status: Home / Self Care | Payer: BC Managed Care – PPO | Source: Ambulatory Visit | Attending: Obstetrics and Gynecology | Admitting: Obstetrics and Gynecology

## 2012-12-03 DIAGNOSIS — O923 Agalactia: Secondary | ICD-10-CM | POA: Insufficient documentation

## 2013-01-03 ENCOUNTER — Encounter (HOSPITAL_COMMUNITY): Payer: BC Managed Care – PPO

## 2013-01-03 ENCOUNTER — Encounter (HOSPITAL_COMMUNITY)
Admission: RE | Admit: 2013-01-03 | Discharge: 2013-01-03 | Disposition: A | Discharge status: Home / Self Care | Payer: BC Managed Care – PPO | Source: Ambulatory Visit | Attending: Obstetrics and Gynecology | Admitting: Obstetrics and Gynecology

## 2013-01-03 DIAGNOSIS — O923 Agalactia: Secondary | ICD-10-CM | POA: Insufficient documentation

## 2013-02-03 ENCOUNTER — Encounter (HOSPITAL_COMMUNITY)
Admission: RE | Admit: 2013-02-03 | Discharge: 2013-02-03 | Disposition: A | Discharge status: Home / Self Care | Payer: Medicaid Other | Source: Ambulatory Visit | Attending: Obstetrics and Gynecology | Admitting: Obstetrics and Gynecology

## 2013-02-03 DIAGNOSIS — O923 Agalactia: Secondary | ICD-10-CM | POA: Insufficient documentation

## 2013-03-06 ENCOUNTER — Encounter (HOSPITAL_COMMUNITY)
Admission: RE | Admit: 2013-03-06 | Discharge: 2013-03-06 | Disposition: A | Discharge status: Home / Self Care | Payer: Medicaid Other | Source: Ambulatory Visit | Attending: Obstetrics and Gynecology | Admitting: Obstetrics and Gynecology

## 2013-03-06 DIAGNOSIS — O923 Agalactia: Secondary | ICD-10-CM | POA: Insufficient documentation

## 2013-11-23 ENCOUNTER — Encounter (HOSPITAL_COMMUNITY): Payer: Self-pay | Admitting: *Deleted

## 2014-01-22 NOTE — L&D Delivery Note (Signed)
Delivery Note Pt forebag ruptured spontaneously and she progressed on her own to complete dilation.  At 6:33 AM a healthy female was delivered via Vaginal, Spontaneous Delivery (Presentation: LOA  ).  APGAR:8 , 9;  weight pending .   Placenta status: Intact, Spontaneous.  Cord:  with the following complications: none  Anesthesia:  epidural Episiotomy: none   Lacerations:  none Suture Repair: n/a Est. Blood Loss (mL):  250cc  Mom to postpartum.  Baby to Couplet care / Skin to Skin.  Logan Bores 09/11/2014, 6:46 AM

## 2014-05-07 LAB — OB RESULTS CONSOLE ABO/RH: RH Type: POSITIVE

## 2014-05-07 LAB — OB RESULTS CONSOLE ANTIBODY SCREEN: Antibody Screen: NEGATIVE

## 2014-05-07 LAB — OB RESULTS CONSOLE RPR: RPR: NONREACTIVE

## 2014-05-07 LAB — OB RESULTS CONSOLE HEPATITIS B SURFACE ANTIGEN: HEP B S AG: NEGATIVE

## 2014-05-07 LAB — OB RESULTS CONSOLE HIV ANTIBODY (ROUTINE TESTING): HIV: NONREACTIVE

## 2014-05-07 LAB — OB RESULTS CONSOLE GC/CHLAMYDIA
CHLAMYDIA, DNA PROBE: NEGATIVE
Gonorrhea: NEGATIVE

## 2014-05-07 LAB — OB RESULTS CONSOLE RUBELLA ANTIBODY, IGM: Rubella: IMMUNE

## 2014-08-31 LAB — US OB FOLLOW UP

## 2014-09-02 ENCOUNTER — Other Ambulatory Visit (HOSPITAL_COMMUNITY): Payer: Self-pay | Admitting: Obstetrics and Gynecology

## 2014-09-02 ENCOUNTER — Encounter (HOSPITAL_COMMUNITY): Payer: Self-pay

## 2014-09-02 ENCOUNTER — Ambulatory Visit (HOSPITAL_COMMUNITY)
Admission: RE | Admit: 2014-09-02 | Discharge: 2014-09-02 | Disposition: A | Discharge status: Home / Self Care | Payer: Medicaid Other | Source: Ambulatory Visit | Attending: Obstetrics and Gynecology | Admitting: Obstetrics and Gynecology

## 2014-09-02 DIAGNOSIS — Z3A35 35 weeks gestation of pregnancy: Secondary | ICD-10-CM

## 2014-09-02 DIAGNOSIS — O283 Abnormal ultrasonic finding on antenatal screening of mother: Secondary | ICD-10-CM | POA: Diagnosis not present

## 2014-09-03 ENCOUNTER — Other Ambulatory Visit (HOSPITAL_COMMUNITY): Payer: Self-pay | Admitting: Obstetrics and Gynecology

## 2014-09-03 ENCOUNTER — Encounter (HOSPITAL_COMMUNITY): Payer: Self-pay | Admitting: Obstetrics and Gynecology

## 2014-09-11 ENCOUNTER — Inpatient Hospital Stay (HOSPITAL_COMMUNITY)
Admission: AD | Admit: 2014-09-11 | Discharge: 2014-09-12 | DRG: 774 | Disposition: A | Discharge status: Home / Self Care | Payer: Medicaid Other | Source: Ambulatory Visit | Attending: Obstetrics and Gynecology | Admitting: Obstetrics and Gynecology

## 2014-09-11 ENCOUNTER — Inpatient Hospital Stay (HOSPITAL_COMMUNITY): Payer: Medicaid Other | Admitting: Anesthesiology

## 2014-09-11 ENCOUNTER — Encounter (HOSPITAL_COMMUNITY): Payer: Self-pay

## 2014-09-11 DIAGNOSIS — K219 Gastro-esophageal reflux disease without esophagitis: Secondary | ICD-10-CM | POA: Diagnosis present

## 2014-09-11 DIAGNOSIS — O24420 Gestational diabetes mellitus in childbirth, diet controlled: Secondary | ICD-10-CM | POA: Diagnosis present

## 2014-09-11 DIAGNOSIS — O9962 Diseases of the digestive system complicating childbirth: Secondary | ICD-10-CM | POA: Diagnosis present

## 2014-09-11 DIAGNOSIS — O9852 Other viral diseases complicating childbirth: Secondary | ICD-10-CM | POA: Diagnosis present

## 2014-09-11 DIAGNOSIS — O99824 Streptococcus B carrier state complicating childbirth: Principal | ICD-10-CM | POA: Diagnosis present

## 2014-09-11 DIAGNOSIS — Z8249 Family history of ischemic heart disease and other diseases of the circulatory system: Secondary | ICD-10-CM | POA: Diagnosis not present

## 2014-09-11 DIAGNOSIS — Z833 Family history of diabetes mellitus: Secondary | ICD-10-CM | POA: Diagnosis not present

## 2014-09-11 DIAGNOSIS — Z832 Family history of diseases of the blood and blood-forming organs and certain disorders involving the immune mechanism: Secondary | ICD-10-CM

## 2014-09-11 DIAGNOSIS — O9952 Diseases of the respiratory system complicating childbirth: Secondary | ICD-10-CM | POA: Diagnosis present

## 2014-09-11 DIAGNOSIS — Z3A37 37 weeks gestation of pregnancy: Secondary | ICD-10-CM | POA: Diagnosis present

## 2014-09-11 DIAGNOSIS — J45909 Unspecified asthma, uncomplicated: Secondary | ICD-10-CM | POA: Diagnosis present

## 2014-09-11 DIAGNOSIS — IMO0001 Reserved for inherently not codable concepts without codable children: Secondary | ICD-10-CM

## 2014-09-11 LAB — CBC
HEMATOCRIT: 32.6 % — AB (ref 36.0–46.0)
Hemoglobin: 10.3 g/dL — ABNORMAL LOW (ref 12.0–15.0)
MCH: 24 pg — ABNORMAL LOW (ref 26.0–34.0)
MCHC: 31.6 g/dL (ref 30.0–36.0)
MCV: 75.8 fL — AB (ref 78.0–100.0)
Platelets: 198 10*3/uL (ref 150–400)
RBC: 4.3 MIL/uL (ref 3.87–5.11)
RDW: 13.9 % (ref 11.5–15.5)
WBC: 12 10*3/uL — AB (ref 4.0–10.5)

## 2014-09-11 LAB — RPR: RPR Ser Ql: NONREACTIVE

## 2014-09-11 LAB — TYPE AND SCREEN
ABO/RH(D): A POS
ANTIBODY SCREEN: NEGATIVE

## 2014-09-11 LAB — ABO/RH: ABO/RH(D): A POS

## 2014-09-11 MED ORDER — OXYCODONE-ACETAMINOPHEN 5-325 MG PO TABS: 2.0000 | ORAL_TABLET | ORAL | Status: DC | PRN

## 2014-09-11 MED ORDER — PRENATAL MULTIVITAMIN CH
1.0000 | ORAL_TABLET | Freq: Every day | ORAL | Status: DC
  Administered 2014-09-11 – 2014-09-12 (×2): 1 via ORAL
  Filled 2014-09-11 (×2): qty 1

## 2014-09-11 MED ORDER — FLEET ENEMA 7-19 GM/118ML RE ENEM: 1.0000 | ENEMA | RECTAL | Status: DC | PRN

## 2014-09-11 MED ORDER — OXYTOCIN 40 UNITS IN LACTATED RINGERS INFUSION - SIMPLE MED
62.5000 mL/h | INTRAVENOUS | Status: DC
  Filled 2014-09-11: qty 1000

## 2014-09-11 MED ORDER — OXYCODONE-ACETAMINOPHEN 5-325 MG PO TABS: 1.0000 | ORAL_TABLET | ORAL | Status: DC | PRN

## 2014-09-11 MED ORDER — BENZOCAINE-MENTHOL 20-0.5 % EX AERO
1.0000 "application " | INHALATION_SPRAY | CUTANEOUS | Status: DC | PRN
  Administered 2014-09-11: 1 via TOPICAL
  Filled 2014-09-11: qty 56

## 2014-09-11 MED ORDER — DIPHENHYDRAMINE HCL 50 MG/ML IJ SOLN: 12.5000 mg | INTRAMUSCULAR | Status: DC | PRN

## 2014-09-11 MED ORDER — LACTATED RINGERS IV SOLN
INTRAVENOUS | Status: DC
  Administered 2014-09-11 (×2): via INTRAVENOUS

## 2014-09-11 MED ORDER — ALBUTEROL SULFATE (2.5 MG/3ML) 0.083% IN NEBU: 3.0000 mL | INHALATION_SOLUTION | Freq: Four times a day (QID) | RESPIRATORY_TRACT | Status: DC | PRN

## 2014-09-11 MED ORDER — OXYCODONE-ACETAMINOPHEN 5-325 MG PO TABS
1.0000 | ORAL_TABLET | ORAL | Status: DC | PRN
  Administered 2014-09-11 – 2014-09-12 (×3): 1 via ORAL
  Filled 2014-09-11 (×3): qty 1

## 2014-09-11 MED ORDER — DIPHENHYDRAMINE HCL 25 MG PO CAPS: 25.0000 mg | ORAL_CAPSULE | Freq: Four times a day (QID) | ORAL | Status: DC | PRN

## 2014-09-11 MED ORDER — ACETAMINOPHEN 325 MG PO TABS: 650.0000 mg | ORAL_TABLET | ORAL | Status: DC | PRN

## 2014-09-11 MED ORDER — SENNOSIDES-DOCUSATE SODIUM 8.6-50 MG PO TABS
2.0000 | ORAL_TABLET | ORAL | Status: DC
  Administered 2014-09-11: 2 via ORAL
  Filled 2014-09-11: qty 2

## 2014-09-11 MED ORDER — ZOLPIDEM TARTRATE 5 MG PO TABS: 5.0000 mg | ORAL_TABLET | Freq: Every evening | ORAL | Status: DC | PRN

## 2014-09-11 MED ORDER — LIDOCAINE HCL (PF) 1 % IJ SOLN
INTRAMUSCULAR | Status: DC | PRN
  Administered 2014-09-11 (×2): 5 mL

## 2014-09-11 MED ORDER — ONDANSETRON HCL 4 MG PO TABS: 4.0000 mg | ORAL_TABLET | ORAL | Status: DC | PRN

## 2014-09-11 MED ORDER — SIMETHICONE 80 MG PO CHEW: 80.0000 mg | CHEWABLE_TABLET | ORAL | Status: DC | PRN

## 2014-09-11 MED ORDER — LANOLIN HYDROUS EX OINT: TOPICAL_OINTMENT | CUTANEOUS | Status: DC | PRN

## 2014-09-11 MED ORDER — EPHEDRINE 5 MG/ML INJ
10.0000 mg | INTRAVENOUS | Status: DC | PRN
  Filled 2014-09-11: qty 2

## 2014-09-11 MED ORDER — DIBUCAINE 1 % RE OINT
1.0000 "application " | TOPICAL_OINTMENT | RECTAL | Status: DC | PRN
  Administered 2014-09-11: 1 via RECTAL
  Filled 2014-09-11: qty 28

## 2014-09-11 MED ORDER — ONDANSETRON HCL 4 MG/2ML IJ SOLN: 4.0000 mg | Freq: Four times a day (QID) | INTRAMUSCULAR | Status: DC | PRN

## 2014-09-11 MED ORDER — ONDANSETRON HCL 4 MG/2ML IJ SOLN: 4.0000 mg | INTRAMUSCULAR | Status: DC | PRN

## 2014-09-11 MED ORDER — SODIUM CHLORIDE 0.9 % IV SOLN
2.0000 g | Freq: Once | INTRAVENOUS | Status: AC
  Administered 2014-09-11: 2 g via INTRAVENOUS
  Filled 2014-09-11: qty 2000

## 2014-09-11 MED ORDER — WITCH HAZEL-GLYCERIN EX PADS
1.0000 "application " | MEDICATED_PAD | CUTANEOUS | Status: DC | PRN
  Administered 2014-09-11: 1 via TOPICAL

## 2014-09-11 MED ORDER — FENTANYL 2.5 MCG/ML BUPIVACAINE 1/10 % EPIDURAL INFUSION (WH - ANES)
INTRAMUSCULAR | Status: DC | PRN
  Administered 2014-09-11: 14 mL/h via EPIDURAL

## 2014-09-11 MED ORDER — OXYTOCIN BOLUS FROM INFUSION: 500.0000 mL | INTRAVENOUS | Status: DC

## 2014-09-11 MED ORDER — FENTANYL 2.5 MCG/ML BUPIVACAINE 1/10 % EPIDURAL INFUSION (WH - ANES): 14.0000 mL/h | INTRAMUSCULAR | Status: DC | PRN

## 2014-09-11 MED ORDER — LACTATED RINGERS IV SOLN: 500.0000 mL | INTRAVENOUS | Status: DC | PRN

## 2014-09-11 MED ORDER — PHENYLEPHRINE 40 MCG/ML (10ML) SYRINGE FOR IV PUSH (FOR BLOOD PRESSURE SUPPORT)
80.0000 ug | PREFILLED_SYRINGE | INTRAVENOUS | Status: DC | PRN
  Filled 2014-09-11: qty 2
  Filled 2014-09-11: qty 20

## 2014-09-11 MED ORDER — LIDOCAINE HCL (PF) 1 % IJ SOLN
30.0000 mL | INTRAMUSCULAR | Status: DC | PRN
Start: 2014-09-11 — End: 2014-09-11
  Filled 2014-09-11: qty 30

## 2014-09-11 MED ORDER — TETANUS-DIPHTH-ACELL PERTUSSIS 5-2.5-18.5 LF-MCG/0.5 IM SUSP: 0.5000 mL | Freq: Once | INTRAMUSCULAR | Status: DC

## 2014-09-11 MED ORDER — CITRIC ACID-SODIUM CITRATE 334-500 MG/5ML PO SOLN: 30.0000 mL | ORAL | Status: DC | PRN

## 2014-09-11 MED ORDER — FENTANYL 2.5 MCG/ML BUPIVACAINE 1/10 % EPIDURAL INFUSION (WH - ANES)
14.0000 mL/h | INTRAMUSCULAR | Status: DC | PRN
  Administered 2014-09-11: 14 mL/h via EPIDURAL
  Filled 2014-09-11 (×2): qty 125

## 2014-09-11 MED ORDER — IBUPROFEN 600 MG PO TABS
600.0000 mg | ORAL_TABLET | Freq: Four times a day (QID) | ORAL | Status: DC
  Administered 2014-09-11 – 2014-09-12 (×5): 600 mg via ORAL
  Filled 2014-09-11 (×5): qty 1

## 2014-09-11 NOTE — H&P (Signed)
Anne Humphrey is a 34 y.o. female [phone] at 33 3/7 weeks (EDD 09/29/14 by LMP c/w 18 week [place])  presenting to L&D in active labor.  Pt noted to be 6cm on arrival with gush of fluid in lobby.   Prenatal care significant for late start at 18 weeks.  She had GDM with BS controlled by diet.  Her last child was born with Trisomy 10, but screening test were negative.  This pregnancy she had a nomrmal quad screen as well, but declined  Panorama or amniocentesis.  She is GBS positive and rubella non-immue.  Maternal Medical History:  Reason for admission: Contractions.   Contractions: Onset was 1-2 hours ago.   Frequency: regular.   Perceived severity is strong.    Fetal activity: Perceived fetal activity is normal.    Prenatal Complications - Diabetes: gestational. Diabetes is managed by diet.      OB History    Gravida Para Term Preterm AB TAB SAB Ectopic Multiple Living   4 3 1       1     2009 NSVD 7#7oz 2011 NSVD 8#2oz 2014 NSVD 6#10oz Trisoy 21  Past Medical History  Diagnosis Date  . Allergy   . Asthma   . GERD (gastroesophageal reflux disease)    Past Surgical History  Procedure Laterality Date  . Neck mass excision  1989   Family History: family history includes Diabetes in her maternal grandfather; Heart disease in her maternal grandfather; Leukemia in her maternal grandmother. Social History:  reports that she has never smoked. She has never used smokeless tobacco. She reports that she drinks alcohol. She reports that she does not use illicit drugs.   Prenatal Transfer Tool  Maternal Diabetes: Yes:  Diabetes Type:  Diet controlled Genetic Screening: Normal  Quad screen Maternal Ultrasounds/Referrals: Normal Fetal Ultrasounds or other Referrals:  None Maternal Substance Abuse:  No Significant Maternal Medications:  None Significant Maternal Lab Results:  Lab values include: Group B Strep positive Other Comments:  None  Review of Systems  Gastrointestinal: Positive  for abdominal pain.    Dilation: 6.5 Exam by:: Dr. Marvel Plan Blood pressure 126/69, pulse 71, temperature 97.6 F (36.4 C), resp. rate 18, height 5\' 4"  (1.626 m), weight 71.668 kg (158 lb), last menstrual period 12/23/2013, unknown if currently breastfeeding. Maternal Exam:  Uterine Assessment: Contraction strength is moderate.  Contraction frequency is regular.   Abdomen: Patient reports no abdominal tenderness. Fetal presentation: vertex  Introitus: Normal vulva. Normal vagina.    Physical Exam  Constitutional: She appears well-developed and well-nourished.  Cardiovascular: Normal rate.   Respiratory: Effort normal.  GI: Soft.  Genitourinary: Vagina normal.  Neurological: She is alert.  Psychiatric: She has a normal mood and affect.  Cervix c/6-7/BBOW Prenatal labs: ABO, Rh: A/Positive/-- (04/15 0000) Antibody: Negative (04/15 0000) Rubella: Immune (04/15 0000) RPR: Nonreactive (04/15 0000)  HBsAg: Negative (04/15 0000)  HIV: Non-reactive (04/15 0000)  GBS:   POSITIVE One hour GTT 190, declined 3 hour and began fingersticks   Assessment/Plan: Pt admitted in active labor, requesting epidural Will try to get the epidural on board.  Ampicillin running for +GBS Has forebag and will AROM that after has epidural  Morissa Obeirne W 09/11/2014, 1:08 AM

## 2014-09-11 NOTE — Lactation Note (Signed)
This note was copied from the chart of Anne Tieara Flitton. Lactation Consultation Note  Patient Name: Anne Humphrey XENMM'H Date: 09/11/2014 Reason for consult: Initial assessment Baby nursing on right breast when [facility] arrived for visit. Mom reports some nipple tenderness on the left breast from previous latches but reports this feeding the latch feels better. Care for sore nipples reviewed, advised to apply EBM, comfort gels given with instructions. Encouraged to BF with feeding ques. Early term behaviors discussed. Reviewed basic teaching for newborn, Mom is experienced BF. Lactation brochure left for review, advised of OP services and support group. Encouraged to call for assist/questions or concerns.   Maternal Data Has patient been taught Hand Expression?: Yes Does the patient have breastfeeding experience prior to this delivery?: Yes  Feeding Feeding Type: Breast Fed  LATCH Score/Interventions          Comfort (Breast/Nipple): Filling, red/small blisters or bruises, mild/mod discomfort  Problem noted: Mild/Moderate discomfort Interventions (Mild/moderate discomfort): Hand massage;Hand expression;Comfort gels        Lactation Tools Discussed/Used Tools: Comfort gels WIC Program: Yes   Consult Status Consult Status: Follow-up Date: 09/12/14 Follow-up type: In-patient    Katrine Coho 09/11/2014, 8:20 PM

## 2014-09-11 NOTE — Anesthesia Preprocedure Evaluation (Signed)
Anesthesia Evaluation  Patient identified by MRN, date of birth, ID band Patient awake and Patient confused    Reviewed: Allergy & Precautions, H&P , NPO status , Patient's Chart, lab work & pertinent test results  Airway Mallampati: II       Dental   Pulmonary  breath sounds clear to auscultation  Pulmonary exam normal       Cardiovascular Exercise Tolerance: Good Normal cardiovascular examRhythm:regular Rate:Normal     Neuro/Psych    GI/Hepatic   Endo/Other    Renal/GU      Musculoskeletal   Abdominal   Peds  Hematology   Anesthesia Other Findings   Reproductive/Obstetrics (+) Pregnancy                             Anesthesia Physical Anesthesia Plan  ASA: II  Anesthesia Plan: Epidural   Post-op Pain Management:    Induction:   Airway Management Planned:   Additional Equipment:   Intra-op Plan:   Post-operative Plan:   Informed Consent: I have reviewed the patients History and Physical, chart, labs and discussed the procedure including the risks, benefits and alternatives for the proposed anesthesia with the patient or authorized representative who has indicated his/her understanding and acceptance.     Plan Discussed with:   Anesthesia Plan Comments:         Anesthesia Quick Evaluation  

## 2014-09-11 NOTE — Anesthesia Procedure Notes (Signed)
Epidural Patient location during procedure: OB Start time: 09/11/2014 1:19 AM End time: 09/11/2014 1:34 AM  Staffing Anesthesiologist: Alexis Frock  Preanesthetic Checklist Completed: patient identified, site marked, surgical consent, pre-op evaluation, timeout performed, IV checked, risks and benefits discussed and monitors and equipment checked  Epidural Patient position: sitting Prep: site prepped and draped and DuraPrep Patient monitoring: heart rate, continuous pulse ox and blood pressure Approach: midline Location: L4-L5 Injection technique: LOR air  Needle:  Needle type: Tuohy  Needle gauge: 17 G Needle length: 9 cm and 9 Needle insertion depth: 5.5 cm Catheter type: closed end flexible Catheter size: 19 Gauge Catheter at skin depth: 14 cm Test dose: negative  Assessment Events: blood not aspirated, injection not painful, no injection resistance, negative IV test and no paresthesia  Additional Notes   Patient tolerated the insertion well without complications.Reason for block:procedure for pain

## 2014-09-11 NOTE — Progress Notes (Signed)
Patient ID: Anne Humphrey, female   DOB: 1980-10-19, 34 y.o.   MRN: [id number] DOD Doing well  No c/o

## 2014-09-11 NOTE — Anesthesia Postprocedure Evaluation (Signed)
Anesthesia Post Note  Patient: Anne Humphrey  Procedure(s) Performed: * No procedures listed *  Anesthesia type: Epidural  Patient location: Mother/Baby  Post pain: Pain level controlled  Post assessment: Post-op Vital signs reviewed  Last Vitals:  Filed Vitals:   09/11/14 1013  BP: 115/66  Pulse: 56  Temp: 36.8 C  Resp: 18    Post vital signs: Reviewed  Level of consciousness:alert  Complications: No apparent anesthesia complications

## 2014-09-12 LAB — CBC
HEMATOCRIT: 25.7 % — AB (ref 36.0–46.0)
HEMOGLOBIN: 8.2 g/dL — AB (ref 12.0–15.0)
MCH: 24.3 pg — AB (ref 26.0–34.0)
MCHC: 31.9 g/dL (ref 30.0–36.0)
MCV: 76.3 fL — AB (ref 78.0–100.0)
Platelets: 133 10*3/uL — ABNORMAL LOW (ref 150–400)
RBC: 3.37 MIL/uL — ABNORMAL LOW (ref 3.87–5.11)
RDW: 14.1 % (ref 11.5–15.5)
WBC: 8.2 10*3/uL (ref 4.0–10.5)

## 2014-09-12 MED ORDER — IBUPROFEN 600 MG PO TABS: 600.0000 mg | ORAL_TABLET | Freq: Four times a day (QID) | ORAL | Status: DC

## 2014-09-12 MED ORDER — OXYCODONE-ACETAMINOPHEN 5-325 MG PO TABS: 1.0000 | ORAL_TABLET | ORAL | Status: DC | PRN

## 2014-09-12 NOTE — Lactation Note (Signed)
This note was copied from the chart of Anne Mariem Skolnick. Lactation Consultation Note  Patient Name: Anne Humphrey KDTOI'Z Date: 09/12/2014 Reason for consult: Follow-up assessment  Baby is 55 hours old and for D/C today . Per mom will be returning to [facility]  For weight check . Weight today -3% weight loss , 6-6.3 oz, Breast feeding consistently  And last fed at 1115 am for 15 mins , Latch scores 7-9 , Voids and stools adequate for age. Last  Wet at 0600 , and last stool at 0100 - large mec per mom. Bili check at 16 hours =1.1 . Mom has been using comfort gels for some sore ness, and per mom laid back position is helping. Sore nipple and engorgement prevention and tx reviewed. Referring to the baby and me booklet. Per mom has a DEBP at home. Mom will call back for [facility] O/P apt when she knows her schedule. Mother informed of post-discharge support and given phone number to the lactation department, including  services for phone call assistance; out-patient appointments; and breastfeeding support group. List of other  breastfeeding resources in the community given in the handout. Encouraged mother to call for problems or  concerns related to breastfeeding.   Maternal Data    Feeding Feeding Type:  (per mom last fed at 1115 for 15 mins ) Length of feed: 35 min  LATCH Score/Interventions                      Lactation Tools Discussed/Used Pump Review: Milk Storage Initiated by:: MAI  Date initiated:: 09/12/14   Consult Status Consult Status: Complete Date: 09/12/14    Myer Haff 09/12/2014, 1:04 PM

## 2014-09-12 NOTE — Progress Notes (Signed)
Post Partum Day 1 Subjective: no complaints and tolerating PO, requests early d/c  Objective: Blood pressure 122/84, pulse 61, temperature 98.1 F (36.7 C), temperature source Oral, resp. rate 18, height 5\' 4"  (1.626 m), weight 71.668 kg (158 lb), last menstrual period 12/23/2013, SpO2 99 %, unknown if currently breastfeeding.  Physical Exam:  General: alert and cooperative Lochia: appropriate Uterine Fundus: firm    Recent Labs  09/11/14 0050 09/12/14 0540  HGB 10.3* 8.2*  HCT 32.6* 25.7*    Assessment/Plan: Discharge home   LOS: 1 day   Anne Humphrey W 09/12/2014, 10:35 AM

## 2014-09-12 NOTE — Discharge Summary (Signed)
Obstetric Discharge Summary Reason for Admission: onset of labor Prenatal Procedures: none Intrapartum Procedures: spontaneous vaginal delivery Postpartum Procedures: none Complications-Operative and Postpartum: none HEMOGLOBIN  Date Value Ref Range Status  09/12/2014 8.2* 12.0 - 15.0 g/dL Final    Comment:    REPEATED TO VERIFY DELTA CHECK NOTED    HCT  Date Value Ref Range Status  09/12/2014 25.7* 36.0 - 46.0 % Final    Physical Exam:  General: alert and cooperative Lochia: appropriate Uterine Fundus: firm   Discharge Diagnoses: Term Pregnancy-delivered  Discharge Information: Date: 09/12/2014 Activity: pelvic rest Diet: routine Medications: Ibuprofen and Percocet Condition: improved Instructions: refer to practice specific booklet Discharge to: home Follow-up Information    Follow up with Logan Bores, MD In 6 weeks.   Specialty:  Obstetrics and Gynecology   Why:  postpartum   Contact information:   [address] [address] [phone]       Newborn Data: Live born female  Birth Weight: 6 lb 9.2 oz (2982 g) APGAR: 8, 9  Home with mother.  Logan Bores 09/12/2014, 10:37 AM

## 2017-08-29 ENCOUNTER — Encounter: Payer: Self-pay | Admitting: Family Medicine

## 2017-08-29 ENCOUNTER — Ambulatory Visit: Payer: BLUE CROSS/BLUE SHIELD | Admitting: Family Medicine

## 2017-08-29 ENCOUNTER — Other Ambulatory Visit: Payer: Self-pay

## 2017-08-29 VITALS — BP 124/84 | HR 75 | Temp 98.3°F | Resp 16 | Ht 63.75 in | Wt 115.4 lb

## 2017-08-29 DIAGNOSIS — F3281 Premenstrual dysphoric disorder: Secondary | ICD-10-CM | POA: Insufficient documentation

## 2017-08-29 DIAGNOSIS — J45909 Unspecified asthma, uncomplicated: Secondary | ICD-10-CM

## 2017-08-29 DIAGNOSIS — G5603 Carpal tunnel syndrome, bilateral upper limbs: Secondary | ICD-10-CM | POA: Diagnosis not present

## 2017-08-29 DIAGNOSIS — J302 Other seasonal allergic rhinitis: Secondary | ICD-10-CM | POA: Insufficient documentation

## 2017-08-29 DIAGNOSIS — J4520 Mild intermittent asthma, uncomplicated: Secondary | ICD-10-CM | POA: Diagnosis not present

## 2017-08-29 DIAGNOSIS — Z8632 Personal history of gestational diabetes: Secondary | ICD-10-CM

## 2017-08-29 DIAGNOSIS — J301 Allergic rhinitis due to pollen: Secondary | ICD-10-CM

## 2017-08-29 HISTORY — DX: Unspecified asthma, uncomplicated: J45.909

## 2017-08-29 HISTORY — DX: Personal history of gestational diabetes: Z86.32

## 2017-08-29 MED ORDER — ESCITALOPRAM OXALATE 10 MG PO TABS: 10.0000 mg | ORAL_TABLET | Freq: Every day | ORAL | 2 refills | Status: DC

## 2017-08-29 MED ORDER — DICLOFENAC SODIUM 75 MG PO TBEC: 75.0000 mg | DELAYED_RELEASE_TABLET | Freq: Two times a day (BID) | ORAL | 0 refills | Status: DC

## 2017-08-29 NOTE — Patient Instructions (Signed)
Please return in 4-6 weeks for complete physical and follow up carpal tunnel and anxiety  It was a pleasure meeting you today! Thank you for choosing [place] to meet your healthcare needs! I truly look forward to working with you. If you have any questions or concerns, please send me a message via Mychart or call the office at [phone].   Carpal Tunnel Syndrome Carpal tunnel syndrome is a condition that causes pain in your hand and arm. The carpal tunnel is a narrow area located on the palm side of your wrist. Repeated wrist motion or certain diseases may cause swelling within the tunnel. This swelling pinches the main nerve in the wrist (median nerve). What are the causes? This condition may be caused by:  Repeated wrist motions.  Wrist injuries.  Arthritis.  A cyst or tumor in the carpal tunnel.  Fluid buildup during pregnancy.  Sometimes the cause of this condition is not known. What increases the risk? This condition is more likely to develop in:  People who have jobs that cause them to repeatedly move their wrists in the same motion, such as Art gallery manager.  Women.  People with certain conditions, such as: ? Diabetes. ? Obesity. ? An underactive thyroid (hypothyroidism). ? Kidney failure.  What are the signs or symptoms? Symptoms of this condition include:  A tingling feeling in your fingers, especially in your thumb, index, and middle fingers.  Tingling or numbness in your hand.  An aching feeling in your entire arm, especially when your wrist and elbow are bent for long periods of time.  Wrist pain that goes up your arm to your shoulder.  Pain that goes down into your palm or fingers.  A weak feeling in your hands. You may have trouble grabbing and holding items.  Your symptoms may feel worse during the night. How is this diagnosed? This condition is diagnosed with a medical history and physical exam. You may also have tests, including:  An  electromyogram (EMG). This test measures electrical signals sent by your nerves into the muscles.  X-rays.  How is this treated? Treatment for this condition includes:  Lifestyle changes. It is important to stop doing or modify the activity that caused your condition.  Physical or occupational therapy.  Medicines for pain and inflammation. This may include medicine that is injected into your wrist.  A wrist splint.  Surgery.  Follow these instructions at home: If you have a splint:  Wear it as told by your health care provider. Remove it only as told by your health care provider.  Loosen the splint if your fingers become numb and tingle, or if they turn cold and blue.  Keep the splint clean and dry. General instructions  Take over-the-counter and prescription medicines only as told by your health care provider.  Rest your wrist from any activity that may be causing your pain. If your condition is work related, talk to your employer about changes that can be made, such as getting a wrist pad to use while typing.  If directed, apply ice to the painful area: ? Put ice in a plastic bag. ? Place a towel between your skin and the bag. ? Leave the ice on for 20 minutes, 2-3 times per day.  Keep all follow-up visits as told by your health care provider. This is important.  Do any exercises as told by your health care provider, physical therapist, or occupational therapist. Contact a health care provider if:  You have new  symptoms.  Your pain is not controlled with medicines.  Your symptoms get worse. This information is not intended to replace advice given to you by your health care provider. Make sure you discuss any questions you have with your health care provider. Document Released: 01/06/2000 Document Revised: 05/19/2015 Document Reviewed: 05/26/2014 Elsevier Interactive Patient Education  2018 [place] and Stress Management Stress is a normal reaction  to life events. It is what you feel when life demands more than you are used to or more than you can handle. Some stress can be useful. For example, the stress reaction can help you catch the last bus of the day, study for a test, or meet a deadline at work. But stress that occurs too often or for too long can cause problems. It can affect your emotional health and interfere with relationships and normal daily activities. Too much stress can weaken your immune system and increase your risk for physical illness. If you already have a medical problem, stress can make it worse. What are the causes? All sorts of life events may cause stress. An event that causes stress for one person may not be stressful for another person. Major life events commonly cause stress. These may be positive or negative. Examples include losing your job, moving into a new home, getting married, having a baby, or losing a loved one. Less obvious life events may also cause stress, especially if they occur day after day or in combination. Examples include working long hours, driving in traffic, caring for children, being in debt, or being in a difficult relationship. What are the signs or symptoms? Stress may cause emotional symptoms including, the following:  Anxiety. This is feeling worried, afraid, on edge, overwhelmed, or out of control.  Anger. This is feeling irritated or impatient.  Depression. This is feeling sad, down, helpless, or guilty.  Difficulty focusing, remembering, or making decisions.  Stress may cause physical symptoms, including the following:  Aches and pains. These may affect your head, neck, back, stomach, or other areas of your body.  Tight muscles or clenched jaw.  Low energy or trouble sleeping.  Stress may cause unhealthy behaviors, including the following:  Eating to feel better (overeating) or skipping meals.  Sleeping too little, too much, or both.  Working too much or putting off tasks  (procrastination).  Smoking, drinking alcohol, or using drugs to feel better.  How is this diagnosed? Stress is diagnosed through an assessment by your health care provider. Your health care provider will ask questions about your symptoms and any stressful life events.Your health care provider will also ask about your medical history and may order blood tests or other tests. Certain medical conditions and medicine can cause physical symptoms similar to stress. Mental illness can cause emotional symptoms and unhealthy behaviors similar to stress. Your health care provider may refer you to a mental health professional for further evaluation. How is this treated? Stress management is the recommended treatment for stress.The goals of stress management are reducing stressful life events and coping with stress in healthy ways. Techniques for reducing stressful life events include the following:  Stress identification. Self-monitor for stress and identify what causes stress for you. These skills may help you to avoid some stressful events.  Time management. Set your priorities, keep a calendar of events, and learn to say "no." These tools can help you avoid making too many commitments.  Techniques for coping with stress include the following:  Rethinking the  problem. Try to think realistically about stressful events rather than ignoring them or overreacting. Try to find the positives in a stressful situation rather than focusing on the negatives.  Exercise. Physical exercise can release both physical and emotional tension. The key is to find a form of exercise you enjoy and do it regularly.  Relaxation techniques. These relax the body and mind. Examples include yoga, meditation, tai chi, biofeedback, deep breathing, progressive muscle relaxation, listening to music, being out in nature, journaling, and other hobbies. Again, the key is to find one or more that you enjoy and can do regularly.  Healthy  lifestyle. Eat a balanced diet, get plenty of sleep, and do not smoke. Avoid using alcohol or drugs to relax.  Strong support network. Spend time with family, friends, or other people you enjoy being around.Express your feelings and talk things over with someone you trust.  Counseling or talktherapy with a mental health professional may be helpful if you are having difficulty managing stress on your own. Medicine is typically not recommended for the treatment of stress.Talk to your health care provider if you think you need medicine for symptoms of stress. Follow these instructions at home:  Keep all follow-up visits as directed by your health care provider.  Take all medicines as directed by your health care provider. Contact a health care provider if:  Your symptoms get worse or you start having new symptoms.  You feel overwhelmed by your problems and can no longer manage them on your own. Get help right away if:  You feel like hurting yourself or someone else. This information is not intended to replace advice given to you by your health care provider. Make sure you discuss any questions you have with your health care provider. Document Released: 07/04/2000 Document Revised: 06/16/2015 Document Reviewed: 09/02/2012 Elsevier Interactive Patient Education  2017 [employer].

## 2017-08-29 NOTE — Progress Notes (Signed)
Subjective  CC:  Chief Complaint  Patient presents with  . Establish Care    having carpal tunnel symptoms in bilateral hands, right is worse, right thumb has been numb for 10 days  . Anxiety    Discuss  . Carpal Tunnel    HPI: Anne Humphrey is a 37 y.o. female who presents to [place] at [facility] today to establish care with me as a new patient. Pleasant married G4P4 with daughter with down's syndrome and another with speech delay. Home maker.   She has the following concerns or needs:  Numbness in R>L hands, especially tips of thumbs. Worse at night. Using advil and splints x 7 days with some improvement. No weakness. No injury. Denies wrist pain. No elbow pain. No numbness in feet.   Anxiety: had been treated for anxiety about 4 years ago with zoloft:improved mood but had RLS side effects causing her to stop. Now with feelings of being overwhelmed at times, easily irritated, loses temper quickly and has trouble handling everyday needs of family. Denies sxs of depression. Appetite is down from sxs. sxs are worse premenstrually. No FH of bipolar disorder.   Allergies and allergic asthma are mild and well controlled.   Assessment  1. Carpal tunnel syndrome, bilateral   2. Mild intermittent extrinsic asthma without complication   3. Seasonal allergic rhinitis due to pollen   4. PMDD (premenstrual dysphoric disorder)      Plan   CTS- treat with splints, ice and nsaids. Return if not improving. Consider emg/ncv testing to be certain of dx  Anxiety: PMDD: start lexapro and educated. Recheck 4-6 weeks. Consider paxil if not improving.   Continue prn allergy and albuterol meds.   Due cpe with pap next visit.   Follow up:  Return in about 3 months (around 11/29/2017) for complete physical. No orders of the defined types were placed in this encounter.  Meds ordered this encounter  Medications  . escitalopram (LEXAPRO) 10 MG tablet    Sig: Take 1 tablet  (10 mg total) by mouth daily.    Dispense:  30 tablet    Refill:  2  . diclofenac (VOLTAREN) 75 MG EC tablet    Sig: Take 1 tablet (75 mg total) by mouth 2 (two) times daily.    Dispense:  30 tablet    Refill:  0     Depression screen PHQ 2/9 08/29/2017  Decreased Interest 0  Down, Depressed, Hopeless 0  PHQ - 2 Score 0    We updated and reviewed the patient's past history in detail and it is documented below.  Patient Active Problem List   Diagnosis Date Noted  . History of gestational diabetes 08/29/2017  . Allergic asthma 08/29/2017  . Seasonal allergic rhinitis 08/29/2017  . PMDD (premenstrual dysphoric disorder) 08/29/2017   Health Maintenance  Topic Date Due  . INFLUENZA VACCINE  08/22/2017  . PAP SMEAR  10/22/2017  . TETANUS/TDAP  08/31/2022  . HIV Screening  Completed   Immunization History  Administered Date(s) Administered  . Influenza, Seasonal, Injecte, Preservative Fre 10/25/2014  . Pneumococcal Polysaccharide-23 08/31/2012  . Tdap 06/27/2011, 08/30/2012, 07/12/2014   Current Meds  Medication Sig  . albuterol (PROVENTIL HFA;VENTOLIN HFA) 108 (90 BASE) MCG/ACT inhaler Inhale 2 puffs into the lungs every 6 (six) hours as needed for wheezing.  Marland Kitchen loratadine (CLARITIN) 10 MG tablet Take 10 mg by mouth daily.  . [DISCONTINUED] ibuprofen (ADVIL,MOTRIN) 600 MG tablet Take 1 tablet (600 mg  total) by mouth every 6 (six) hours.    Allergies: Patient has No Known Allergies. Past Medical History Patient  has a past medical history of Allergic asthma (08/29/2017), Allergy, Asthma, GERD (gastroesophageal reflux disease), and History of gestational diabetes (08/29/2017). Past Surgical History Patient  has a past surgical history that includes Neck mass excision (1989). Family History: Patient family history includes Diabetes in her maternal grandfather; Down syndrome in her daughter; Heart disease in her maternal grandfather; Leukemia in her maternal grandmother; Restless  legs syndrome in her father and mother; Speech disorder in her daughter. Social History:  Patient  reports that she has never smoked. She has never used smokeless tobacco. She reports that she drinks alcohol. She reports that she does not use drugs.  Review of Systems: Constitutional: negative for fever or malaise Ophthalmic: negative for photophobia, double vision or loss of vision Cardiovascular: negative for chest pain, dyspnea on exertion, or new LE swelling Respiratory: negative for SOB or persistent cough Gastrointestinal: negative for abdominal pain, change in bowel habits or melena Genitourinary: negative for dysuria or gross hematuria Musculoskeletal: negative for new gait disturbance or muscular weakness Integumentary: negative for new or persistent rashes Neurological: negative for TIA or stroke symptoms Psychiatric: negative for SI or delusions Allergic/Immunologic: negative for hives  Patient Care Team    Relationship Specialty Notifications Start End  Leamon Arnt, MD PCP - General Family Medicine  08/29/17     Objective  Vitals: BP 124/84   Pulse 75   Temp 98.3 F (36.8 C) (Oral)   Resp 16   Ht 5' 3.75" (1.619 m)   Wt 115 lb 6.4 oz (52.3 kg)   SpO2 98%   BMI 19.96 kg/m  General:  Well developed, well nourished, no acute distress  Psych:  Alert and oriented,normal mood and affect HEENT:  Normocephalic, atraumatic, non-icteric sclera, PERRL, oropharynx is without mass or exudate, supple neck without adenopathy, mass or thyromegaly Cardiovascular:  RRR without gallop, rub or murmur, nondisplaced PMI Respiratory:  Good breath sounds bilaterally, CTAB with normal respiratory effort MSK: no deformities, contusions. Joints are without erythema or swelling + phalen's bilaterally, neg tinnels, neg finkelstiens, nl wrist and elbow exams Skin:  Warm, no rashes or suspicious lesions noted Neurologic:    Mental status is normal. Gross motor and sensory exams are normal.  Normal gait, no tremor   Commons side effects, risks, benefits, and alternatives for medications and treatment plan prescribed today were discussed, and the patient expressed understanding of the given instructions. Patient is instructed to call or message via MyChart if he/she has any questions or concerns regarding our treatment plan. No barriers to understanding were identified. We discussed Red Flag symptoms and signs in detail. Patient expressed understanding regarding what to do in case of urgent or emergency type symptoms.   Medication list was reconciled, printed and provided to the patient in AVS. Patient instructions and summary information was reviewed with the patient as documented in the AVS. This note was prepared with assistance of Dragon voice recognition software. Occasional wrong-word or sound-a-like substitutions may have occurred due to the inherent limitations of voice recognition software

## 2017-09-20 ENCOUNTER — Other Ambulatory Visit: Payer: Self-pay | Admitting: Family Medicine

## 2017-10-10 ENCOUNTER — Other Ambulatory Visit: Payer: Self-pay

## 2017-10-10 ENCOUNTER — Ambulatory Visit (INDEPENDENT_AMBULATORY_CARE_PROVIDER_SITE_OTHER): Payer: BLUE CROSS/BLUE SHIELD | Admitting: Family Medicine

## 2017-10-10 ENCOUNTER — Encounter: Payer: Self-pay | Admitting: Family Medicine

## 2017-10-10 ENCOUNTER — Other Ambulatory Visit (HOSPITAL_COMMUNITY)
Admission: RE | Admit: 2017-10-10 | Discharge: 2017-10-10 | Disposition: A | Discharge status: Home / Self Care | Payer: BLUE CROSS/BLUE SHIELD | Source: Ambulatory Visit | Attending: Family Medicine | Admitting: Family Medicine

## 2017-10-10 VITALS — BP 112/76 | HR 69 | Temp 97.6°F | Ht 63.75 in | Wt 113.8 lb

## 2017-10-10 DIAGNOSIS — J301 Allergic rhinitis due to pollen: Secondary | ICD-10-CM | POA: Diagnosis not present

## 2017-10-10 DIAGNOSIS — Z124 Encounter for screening for malignant neoplasm of cervix: Secondary | ICD-10-CM

## 2017-10-10 DIAGNOSIS — G5603 Carpal tunnel syndrome, bilateral upper limbs: Secondary | ICD-10-CM

## 2017-10-10 DIAGNOSIS — Z0000 Encounter for general adult medical examination without abnormal findings: Secondary | ICD-10-CM

## 2017-10-10 DIAGNOSIS — F3281 Premenstrual dysphoric disorder: Secondary | ICD-10-CM | POA: Diagnosis not present

## 2017-10-10 DIAGNOSIS — J4520 Mild intermittent asthma, uncomplicated: Secondary | ICD-10-CM | POA: Diagnosis not present

## 2017-10-10 DIAGNOSIS — Z23 Encounter for immunization: Secondary | ICD-10-CM | POA: Diagnosis not present

## 2017-10-10 LAB — CBC WITH DIFFERENTIAL/PLATELET
BASOS ABS: 0 10*3/uL (ref 0.0–0.1)
Basophils Relative: 0.5 % (ref 0.0–3.0)
EOS ABS: 0.2 10*3/uL (ref 0.0–0.7)
Eosinophils Relative: 2.7 % (ref 0.0–5.0)
HCT: 35.9 % — ABNORMAL LOW (ref 36.0–46.0)
Hemoglobin: 11.8 g/dL — ABNORMAL LOW (ref 12.0–15.0)
LYMPHS ABS: 1.8 10*3/uL (ref 0.7–4.0)
LYMPHS PCT: 32.8 % (ref 12.0–46.0)
MCHC: 32.8 g/dL (ref 30.0–36.0)
MCV: 81.5 fl (ref 78.0–100.0)
Monocytes Absolute: 0.4 10*3/uL (ref 0.1–1.0)
Monocytes Relative: 7.9 % (ref 3.0–12.0)
NEUTROS ABS: 3.1 10*3/uL (ref 1.4–7.7)
NEUTROS PCT: 56.1 % (ref 43.0–77.0)
PLATELETS: 221 10*3/uL (ref 150.0–400.0)
RBC: 4.4 Mil/uL (ref 3.87–5.11)
RDW: 17 % — ABNORMAL HIGH (ref 11.5–15.5)
WBC: 5.6 10*3/uL (ref 4.0–10.5)

## 2017-10-10 LAB — COMPREHENSIVE METABOLIC PANEL
ALT: 7 U/L (ref 0–35)
AST: 11 U/L (ref 0–37)
Albumin: 4.2 g/dL (ref 3.5–5.2)
Alkaline Phosphatase: 53 U/L (ref 39–117)
BILIRUBIN TOTAL: 0.7 mg/dL (ref 0.2–1.2)
BUN: 11 mg/dL (ref 6–23)
CO2: 29 meq/L (ref 19–32)
Calcium: 8.9 mg/dL (ref 8.4–10.5)
Chloride: 106 mEq/L (ref 96–112)
Creatinine, Ser: 0.61 mg/dL (ref 0.40–1.20)
GFR: 117.24 mL/min (ref >60.00)
GLUCOSE: 81 mg/dL (ref 70–99)
Potassium: 4 mEq/L (ref 3.5–5.1)
SODIUM: 141 meq/L (ref 135–145)
Total Protein: 6.4 g/dL (ref 6.0–8.3)

## 2017-10-10 LAB — LIPID PANEL
CHOL/HDL RATIO: 4
Cholesterol: 167 mg/dL (ref 0–200)
HDL: 43.3 mg/dL (ref >39.00)
LDL Cholesterol: 102 mg/dL — ABNORMAL HIGH (ref 0–99)
NONHDL: 123.55
Triglycerides: 108 mg/dL (ref 0.0–149.0)
VLDL: 21.6 mg/dL (ref 0.0–40.0)

## 2017-10-10 LAB — TSH: TSH: 0.34 u[IU]/mL — ABNORMAL LOW (ref 0.35–4.50)

## 2017-10-10 MED ORDER — ESCITALOPRAM OXALATE 10 MG PO TABS: 20.0000 mg | ORAL_TABLET | Freq: Every day | ORAL | 1 refills | Status: DC

## 2017-10-10 MED ORDER — FLUTICASONE PROPIONATE 50 MCG/ACT NA SUSP: 2.0000 | Freq: Every day | NASAL | 6 refills | Status: DC

## 2017-10-10 MED ORDER — PEAK FLOW METER DEVI: 0 refills | Status: DC

## 2017-10-10 NOTE — Progress Notes (Signed)
Subjective  Chief Complaint  Patient presents with  . Annual Exam    doing well, requests flu shot today, HM up to date  . Anxiety    states she is doing better    HPI: Anne Humphrey is a 37 y.o. female who presents to [place] at [facility] today for a Female Wellness Visit.  She also has the concerns and/or needs as listed above in the chief complaint. These will be addressed in addition to the Health Maintenance Visit.   Wellness Visit: annual visit with health maintenance review and exam with Pap   HM: Pap and flu shot today. Diet is ok - too busy to eat a lot. Stay at home mom. Due for labs.  Chronic disease management visit and/or acute problem visit:  Anxiety: started lexapro about 6 weeks and has noticed some improvement.  Less rage but irritability remains.  No symptoms of depression.  Please see last note.  No adverse side effects.  She denies problems sleeping.  Living stressful life with 4 young children and husband who is suffering from anxiety.  Allergy and asthma follow-up: Patient admits that she has had postnasal drainage and sneezing with nasal congestion over the last month or 2.  Has needed to use inhaler intermittently.  Has noted some wheezing but no significant coughing, shortness of breath or dyspnea on exertion.  Weight loss and anxiety: Patient worries about thyroid.  Had history of neck mass excision in the 80s.  Denies palpitations or tremor.  She has occasional sweats worse on Lexapro.  Some constipation.  Carpal tunnel: Use night splints for 4 to 6 weeks and pain resolved.  Still has intermittent numbness but is not that bothersome.  No weakness.  Definitely improved Assessment  1. Annual physical exam   2. Cervical cancer screening   3. PMDD (premenstrual dysphoric disorder)   4. Carpal tunnel syndrome, bilateral   5. Seasonal allergic rhinitis due to pollen   6. Mild intermittent extrinsic asthma without complication      Plan    Female Wellness Visit:  Age appropriate Health Maintenance and Prevention measures were discussed with patient. Included topics are cancer screening recommendations, ways to keep healthy (see AVS) including dietary and exercise recommendations, regular eye and dental care, use of seat belts, and avoidance of moderate alcohol use and tobacco use.  Pap smear done today.  BMI: discussed patient's BMI and encouraged positive lifestyle modifications to help get to or maintain a target BMI.  Discussed better nutrition  HM needs and immunizations were addressed and ordered. See below for orders. See HM and immunization section for updates.  Flu shot today  Routine labs and screening tests ordered including cmp, cbc and lipids where appropriate.  Discussed recommendations regarding Vit D and calcium supplementation (see AVS)  Chronic disease f/u and/or acute problem visit: (deemed necessary to be done in addition to the wellness visit):  Anxiety follow-up: Mild improvement after 6 weeks of Lexapro.  Increase Lexapro to 20 mg daily and reassess in 6 weeks.  Allergies and asthma: Currently active.  Education given.  Start peak flow monitoring, start Zyrtec or Claritin with Flonase.  Reassess in 6 weeks.  May need to increase asthma medication.  Weight loss: Check TSH.  Suspect undernutrition due to anxiety and busy lifestyle  Carpal tunnel syndrome: Improving.  Patient defers further treatment or evaluation at this time.  Follow-up if worsens again.  Preventive strategies discussed  Follow up: Return in about 6 weeks (  around 11/21/2017) for mood follow up, asthma recheck.   Orders Placed This Encounter  Procedures  . CBC with Differential/Platelet  . Comprehensive metabolic panel  . Lipid panel  . HIV Antibody (routine testing w rflx)   Meds ordered this encounter  Medications  . escitalopram (LEXAPRO) 10 MG tablet    Sig: Take 2 tablets (20 mg total) by mouth daily.    Dispense:  90  tablet    Refill:  1  . fluticasone (FLONASE) 50 MCG/ACT nasal spray    Sig: Place 2 sprays into both nostrils daily.    Dispense:  16 g    Refill:  6  . Peak Flow Meter DEVI    Sig: Use as directed    Dispense:  12 each    Refill:  0      Lifestyle: Body mass index is 19.69 kg/m. Wt Readings from Last 3 Encounters:  10/10/17 113 lb 12.8 oz (51.6 kg)  08/29/17 115 lb 6.4 oz (52.3 kg)  09/11/14 158 lb (71.7 kg)   Diet: general Exercise: never,  Need for contraception: No, vasectomy  Patient Active Problem List   Diagnosis Date Noted  . History of gestational diabetes 08/29/2017  . Allergic asthma 08/29/2017  . Seasonal allergic rhinitis 08/29/2017  . PMDD (premenstrual dysphoric disorder) 08/29/2017   Health Maintenance  Topic Date Due  . INFLUENZA VACCINE  08/22/2017  . PAP SMEAR  10/22/2017  . TETANUS/TDAP  07/11/2024  . HIV Screening  Completed   Immunization History  Administered Date(s) Administered  . Influenza, Seasonal, Injecte, Preservative Fre 10/25/2014  . Pneumococcal Polysaccharide-23 08/31/2012  . Tdap 06/27/2011, 08/30/2012, 07/12/2014   We updated and reviewed the patient's past history in detail and it is documented below. Allergies: Patient  reports that she drinks alcohol. Past Medical History Patient  has a past medical history of Allergic asthma (08/29/2017), Allergy, Asthma, Chicken pox, Depression, GERD (gastroesophageal reflux disease), History of frequent urinary tract infections, and History of gestational diabetes (08/29/2017). Past Surgical History Patient  has a past surgical history that includes Neck mass excision (1989). Social History   Socioeconomic History  . Marital status: Married    Spouse name: Not on file  . Number of children: 4  . Years of education: Not on file  . Highest education level: Not on file  Occupational History  . 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 Financial resource strain: Not on  file  . Food insecurity:    Worry: Not on file    Inability: Not on file  . Transportation needs:    Medical: Not on file    Non-medical: Not on file  Tobacco Use  . Smoking status: Never Smoker  . Smokeless tobacco: Never Used  Substance and Sexual Activity  . Alcohol use: Yes    Comment: rarely  . Drug use: No  . Sexual activity: Yes    Birth control/protection: Other-see comments    Comment: Husband had a vasectomy  Lifestyle  . Physical activity:    Days per week: Not on file    Minutes per session: Not on file  . Stress: Not on file  Relationships  . Social connections:    Talks on phone: Not on file    Gets together: Not on file    Attends religious service: Not on file    Active member of club or organization: Not on file    Attends meetings of clubs or organizations: Not on file  Relationship status: Not on file  Other Topics Concern  . Not on file  Social History Narrative  . Not on file   Family History  Problem Relation Age of Onset  . Leukemia Maternal Grandmother   . Cancer Maternal Grandmother   . Heart disease Maternal Grandfather   . Diabetes Maternal Grandfather   . Down syndrome Daughter   . Speech disorder Daughter   . Restless legs syndrome Mother   . Restless legs syndrome Father   . Stroke Paternal Grandfather     Review of Systems: Constitutional: negative for fever or malaise Ophthalmic: negative for photophobia, double vision or loss of vision Cardiovascular: negative for chest pain, dyspnea on exertion, or new LE swelling Respiratory: negative for SOB or persistent cough Gastrointestinal: negative for abdominal pain, change in bowel habits or melena Genitourinary: negative for dysuria or gross hematuria, no abnormal uterine bleeding or disharge Musculoskeletal: negative for new gait disturbance or muscular weakness Integumentary: negative for new or persistent rashes, no breast lumps Neurological: negative for TIA or stroke  symptoms Psychiatric: negative for SI or delusions Allergic/Immunologic: negative for hives  Patient Care Team    Relationship Specialty Notifications Start End  Leamon Arnt, MD PCP - General Family Medicine  08/29/17     Objective  Vitals: BP 112/76   Pulse 69   Temp 97.6 F (36.4 C)   Ht 5' 3.75" (1.619 m)   Wt 113 lb 12.8 oz (51.6 kg)   LMP 09/27/2017   SpO2 98%   BMI 19.69 kg/m  General:  Well developed, well nourished, no acute distress  Psych:  Alert and orientedx3,normal mood and affect HEENT:  Normocephalic, atraumatic, non-icteric sclera, PERRL, oropharynx is clear without mass or exudate, supple neck without adenopathy, mass or thyromegaly, nasal congestion present Cardiovascular:  Normal S1, S2, RRR without gallop, rub or murmur, nondisplaced PMI Respiratory:  Good breath sounds bilaterally, basilar wheezing with rhonchi without rales.  Normal respiratory effort  gastrointestinal: normal bowel sounds, soft, non-tender, no noted masses. No HSM MSK: no deformities, contusions. Joints are without erythema or swelling. Spine and CVA region are nontender Skin:  Warm, no rashes or suspicious lesions noted Neurologic:    Mental status is normal. CN 2-11 are normal. Gross motor and sensory exams are normal. Normal gait. No tremor Breast Exam: No mass, skin retraction or nipple discharge is appreciated in either breast. No axillary adenopathy. Fibrocystic changes are not noted Pelvic Exam: Normal external genitalia, no vulvar or vaginal lesions present. Clear multiparous cervix w/o CMT. Bimanual exam reveals a nontender fundus w/o masses, nl size. No adnexal masses present. No inguinal adenopathy. A PAP smear was performed.     Commons side effects, risks, benefits, and alternatives for medications and treatment plan prescribed today were discussed, and the patient expressed understanding of the given instructions. Patient is instructed to call or message via MyChart if he/she  has any questions or concerns regarding our treatment plan. No barriers to understanding were identified. We discussed Red Flag symptoms and signs in detail. Patient expressed understanding regarding what to do in case of urgent or emergency type symptoms.   Medication list was reconciled, printed and provided to the patient in AVS. Patient instructions and summary information was reviewed with the patient as documented in the AVS. This note was prepared with assistance of Dragon voice recognition software. Occasional wrong-word or sound-a-like substitutions may have occurred due to the inherent limitations of voice recognition software

## 2017-10-10 NOTE — Patient Instructions (Addendum)
Please return in 6 weeks to recheck and recheck your asthma and allergies.  If you have any questions or concerns, please don't hesitate to send me a message via MyChart or call the office at [phone]. Thank you for visiting with Anne Humphrey today! It's our pleasure caring for you.  Please do these things to maintain good health!   Exercise at least 30-45 minutes a day,  4-5 days a week.   Eat a low-fat diet with lots of fruits and vegetables, up to 7-9 servings per day.  Drink plenty of water daily. Try to drink 8 8oz glasses per day.  Seatbelts can save your life. Always wear your seatbelt.  Place Smoke Detectors on every level of your home and check batteries every year.  Schedule an appointment with an eye doctor for an eye exam every 1-2 years  Safe sex - use condoms to protect yourself from STDs if you could be exposed to these types of infections. Use birth control if you do not want to become pregnant and are sexually active.  Avoid heavy alcohol use. If you drink, keep it to less than 2 drinks/day and not every day.  [place].  Choose someone you trust that could speak for you if you became unable to speak for yourself.  Depression is common in our stressful world.If you're feeling down or losing interest in things you normally enjoy, please come in for a visit.  If anyone is threatening or hurting you, please get help. Physical or Emotional Violence is never OK.    Asthma, Adult Asthma is a recurring condition in which the airways tighten and narrow. Asthma can make it difficult to breathe. It can cause coughing, wheezing, and shortness of breath. Asthma episodes, also called asthma attacks, range from minor to life-threatening. Asthma cannot be cured, but medicines and lifestyle changes can help control it. What are the causes? Asthma is believed to be caused by inherited (genetic) and environmental factors, but its exact cause is unknown. Asthma may be  triggered by allergens, lung infections, or irritants in the air. Asthma triggers are different for each person. Common triggers include:  Animal dander.  Dust mites.  Cockroaches.  Pollen from trees or grass.  Mold.  Smoke.  Air pollutants such as dust, household cleaners, hair sprays, aerosol sprays, paint fumes, strong chemicals, or strong odors.  Cold air, weather changes, and winds (which increase molds and pollens in the air).  Strong emotional expressions such as crying or laughing hard.  Stress.  Certain medicines (such as aspirin) or types of drugs (such as beta-blockers).  Sulfites in foods and drinks. Foods and drinks that may contain sulfites include dried fruit, potato chips, and sparkling grape juice.  Infections or inflammatory conditions such as the flu, a cold, or an inflammation of the nasal membranes (rhinitis).  Gastroesophageal reflux disease (GERD).  Exercise or strenuous activity.  What are the signs or symptoms? Symptoms may occur immediately after asthma is triggered or many hours later. Symptoms include:  Wheezing.  Excessive nighttime or early morning coughing.  Frequent or severe coughing with a common cold.  Chest tightness.  Shortness of breath.  How is this diagnosed? The diagnosis of asthma is made by a review of your medical history and a physical exam. Tests may also be performed. These may include:  Lung function studies. These tests show how much air you breathe in and out.  Allergy tests.  Imaging tests such as X-rays.  How  is this treated? Asthma cannot be cured, but it can usually be controlled. Treatment involves identifying and avoiding your asthma triggers. It also involves medicines. There are 2 classes of medicine used for asthma treatment:  Controller medicines. These prevent asthma symptoms from occurring. They are usually taken every day.  Reliever or rescue medicines. These quickly relieve asthma symptoms. They  are used as needed and provide short-term relief.  Your health care provider will help you create an asthma action plan. An asthma action plan is a written plan for managing and treating your asthma attacks. It includes a list of your asthma triggers and how they may be avoided. It also includes information on when medicines should be taken and when their dosage should be changed. An action plan may also involve the use of a device called a peak flow meter. A peak flow meter measures how well the lungs are working. It helps you monitor your condition. Follow these instructions at home:  Take medicines only as directed by your health care provider. Speak with your health care provider if you have questions about how or when to take the medicines.  Use a peak flow meter as directed by your health care provider. Record and keep track of readings.  Understand and use the action plan to help minimize or stop an asthma attack without needing to seek medical care.  Control your home environment in the following ways to help prevent asthma attacks: ? Do not smoke. Avoid being exposed to secondhand smoke. ? Change your heating and air conditioning filter regularly. ? Limit your use of fireplaces and wood stoves. ? Get rid of pests (such as roaches and mice) and their droppings. ? Throw away plants if you see mold on them. ? Clean your floors and dust regularly. Use unscented cleaning products. ? Try to have someone else vacuum for you regularly. Stay out of rooms while they are being vacuumed and for a short while afterward. If you vacuum, use a dust mask from a hardware store, a double-layered or microfilter vacuum cleaner bag, or a vacuum cleaner with a HEPA filter. ? Replace carpet with wood, tile, or vinyl flooring. Carpet can trap dander and dust. ? Use allergy-proof pillows, mattress covers, and box spring covers. ? Wash bed sheets and blankets every week in hot water and dry them in a  dryer. ? Use blankets that are made of polyester or cotton. ? Clean bathrooms and kitchens with bleach. If possible, have someone repaint the walls in these rooms with mold-resistant paint. Keep out of the rooms that are being cleaned and painted. ? Wash hands frequently. Contact a health care provider if:  You have wheezing, shortness of breath, or a cough even if taking medicine to prevent attacks.  The colored mucus you cough up (sputum) is thicker than usual.  Your sputum changes from clear or white to yellow, green, gray, or bloody.  You have any problems that may be related to the medicines you are taking (such as a rash, itching, swelling, or trouble breathing).  You are using a reliever medicine more than 2-3 times per week.  Your peak flow is still at 50-79% of your personal best after following your action plan for 1 hour.  You have a fever. Get help right away if:  You seem to be getting worse and are unresponsive to treatment during an asthma attack.  You are short of breath even at rest.  You get short of breath when doing  very little physical activity.  You have difficulty eating, drinking, or talking due to asthma symptoms.  You develop chest pain.  You develop a fast heartbeat.  You have a bluish color to your lips or fingernails.  You are light-headed, dizzy, or faint.  Your peak flow is less than 50% of your personal best. This information is not intended to replace advice given to you by your health care provider. Make sure you discuss any questions you have with your health care provider. Document Released: 01/08/2005 Document Revised: 06/22/2015 Document Reviewed: 08/07/2012 Elsevier Interactive Patient Education  2017 [employer]   Peak Flow Meter A peak flow meter is a device that helps you determine how well your asthma is being controlled. The device measures the flow of air out of your lungs. This is a simple but important tool in daily asthma  management. Peak flow meters are available over the counter. The readings from the meter will help you and your health care provider:  Determine the severity of your asthma.  Evaluate the effectiveness of your current treatment.  Determine when to add or stop certain medicines.  Recognize an asthma attack before signs or symptoms appear.  Decide when to seek emergency care.  What are the risks?  Dizziness. Breathing too quickly into the peak flow meter may cause dizziness. This could cause you to pass out. Take your time so you do not get dizzy or light-headed.  False readings. Not cleaning your meter may cause false results. You may not know that your asthma is getting better or getting worse, making you to start or stop treatment improperly. How to find your personal best Your "personal best" is the highest peak flow rate you can reach when you feel good and have no asthma symptoms. This can be used as your standard for comparing your peak flow meter readings. Because everyone's asthma is different, your personal best will be unique to you. Your health care provider will help you to figure out your personal best. Typically, you will take readings once or twice a day for 2 weeks when you are not having symptoms. The highest reading during the trial period is your personal best. Because your lung function can change over time, your personal best should be measured each year. How to use your peak flow meter 1. Move the upper marker to the number that is your personal best. 2. Move the lower marker to the bottom of the numbered scale. 3. Connect the mouthpiece to the peak flow meter. 4. Stand up. 5. Take a deep breath. Make sure you fill your lungs completely. 6. Place your lips tightly around the mouthpiece. Blow as hard and as fast as you can with a single breath (forced exhalation), as if you are blowing out candles. If your lips are not placed tightly around the mouthpiece of the peak  flow meter, you will get incorrect low readings. 7. At the end of your forced exhale, check to see what number the lower marker landed on. This is your peak flow rate. 8. Move the lower marker back to the bottom of the numbered scale. 9. Repeat these steps 2 more times. Record the highest reading of the 3 tries in your asthma diary. Do not calculate the average for your 3 tries, just record the highest. Always write down the results in your asthma diary. After using your peak flow meter, rest and breathe slowly and easily. Keep a record of your progress. Your health care provider  can provide you with a simple table to help with this. For the most accurate readings, it is important to keep your peak flow meter clean. Follow the manufacturer's instructions on how to take care of your peak flow meter. Your health care provider will give you instructions on when to do regular monitoring. You may also need to check your peak flow when:  Coughing, wheezing, or other asthma symptoms wake you up at night.  Your asthma symptoms worsen during the day.  Your breathing is made worse because of a cold, flu, or other respiratory illness.  You need to use your quick-relief, "rescue medicine." It is best to check your peak flow rate before you use rescue medicine, and then 20-30 minutes afterward. This will help determine whether you need to take the rescue medicine again.  How to use your results Use color-coded zones on the meter to see how your peak flow rate compares to your personal best. If your peak flow readings fall too far below your personal best into the yellow or red zone, you will need to take action to prevent or minimize an asthma attack. The color code for each zone reflects progressively more severe symptoms: Green Zone = Stable   Peak flow rate between 80% and 100% of your personal best. Your asthma is under good control when your peak flow rate is in the green zone.  When you are in the  green zone, you are not likely to be experiencing any asthma symptoms.  Only take your regular, preventive medicine. Your rescue medicine is not required. Yellow Zone = Caution   Peak flow rate between 50% and 80% of your personal best. Your asthma is getting worse and could be improved.  You may have begun coughing, wheezing, or feeling chest tightness. Sometimes peak flow rates dip down into the yellow zone before asthma symptoms appear.  Consider increasing or changing your asthma medicine. This may include using your rescue medicine. If you have an asthma action plan, follow each step listed for the yellow zone, including medicine changes. Red Zone = Danger   Peak flow rate below 50% of your personal best. This may mean you are having, or are at risk of having, a medical emergency.  Your coughing, wheezing, or shortness of breath may have become severe. Do not wait to use your rescue medicine. Stop whatever you are doing and use your rescue inhaler, nebulizer, or other medicines to open your airways.  If you have an asthma action plan, follow each step listed for the red zone, including medicine changes and when to seek emergency medical care. Contact a health care provider if: You are in the yellow zone. If you have an asthma action plan, follow all of the steps listed under the yellow zone section of the plan. Let your health care provider know that you are in the yellow zone. Get help right away if: You are in the red zone. If you have an asthma action plan, follow all of the steps listed under the red zone section of the plan while you are seeking immediate medical care. Summary  A peak flow meter is a device that helps you determine how well your asthma is being controlled. The device measures the flow of air out of your lungs.  Readings from the meter will help you determine the severity of your asthma, whether your treatment is working, and when to start or stop  treatment.  Measure your personal best every year during  periods of no symptoms. The meter will compare this reading to your regular readings to determine your condition at a given time.  Work with your health care provider to understand what each zone (green, yellow, red) means and what actions to take in each zone. This information is not intended to replace advice given to you by your health care provider. Make sure you discuss any questions you have with your health care provider. Document Released: 11/05/2006 Document Revised: 01/05/2016 Document Reviewed: 01/05/2016 Elsevier Interactive Patient Education  2017 [employer].

## 2017-10-10 NOTE — Addendum Note (Signed)
Addended bySigurd Sos on: 10/10/2017 09:16 AM   Modules accepted: Orders

## 2017-10-11 ENCOUNTER — Other Ambulatory Visit (INDEPENDENT_AMBULATORY_CARE_PROVIDER_SITE_OTHER): Payer: BLUE CROSS/BLUE SHIELD

## 2017-10-11 ENCOUNTER — Encounter: Payer: Self-pay | Admitting: Family Medicine

## 2017-10-11 DIAGNOSIS — R7989 Other specified abnormal findings of blood chemistry: Secondary | ICD-10-CM

## 2017-10-11 LAB — CYTOLOGY - PAP
DIAGNOSIS: NEGATIVE
HPV (WINDOPATH): NOT DETECTED

## 2017-10-11 LAB — T3, FREE: T3, Free: 3.5 pg/mL (ref 2.3–4.2)

## 2017-10-11 LAB — HIV ANTIBODY (ROUTINE TESTING W REFLEX): HIV 1&2 Ab, 4th Generation: NONREACTIVE

## 2017-10-11 LAB — T4, FREE: FREE T4: 0.79 ng/dL (ref 0.60–1.60)

## 2017-10-11 MED ORDER — ESCITALOPRAM OXALATE 20 MG PO TABS: 20.0000 mg | ORAL_TABLET | Freq: Every day | ORAL | 3 refills | Status: DC

## 2017-10-11 NOTE — Progress Notes (Signed)
Please add on Free T4 and T3, dx: abnormal TSH Thanks, Dr. Aveer Bartow '

## 2017-11-22 ENCOUNTER — Ambulatory Visit: Payer: BLUE CROSS/BLUE SHIELD | Admitting: Family Medicine

## 2020-03-28 ENCOUNTER — Encounter: Payer: Self-pay | Admitting: Family Medicine

## 2020-03-28 ENCOUNTER — Other Ambulatory Visit: Payer: Self-pay

## 2020-03-28 ENCOUNTER — Ambulatory Visit (INDEPENDENT_AMBULATORY_CARE_PROVIDER_SITE_OTHER): Payer: BC Managed Care – PPO | Admitting: Family Medicine

## 2020-03-28 VITALS — BP 108/70 | HR 78 | Temp 98.6°F | Ht 64.0 in | Wt 125.0 lb

## 2020-03-28 DIAGNOSIS — Z630 Problems in relationship with spouse or partner: Secondary | ICD-10-CM

## 2020-03-28 DIAGNOSIS — R41840 Attention and concentration deficit: Secondary | ICD-10-CM | POA: Diagnosis not present

## 2020-03-28 DIAGNOSIS — F411 Generalized anxiety disorder: Secondary | ICD-10-CM

## 2020-03-28 MED ORDER — AMPHETAMINE-DEXTROAMPHET ER 10 MG PO CP24: 10.0000 mg | ORAL_CAPSULE | Freq: Every day | ORAL | 0 refills | Status: DC

## 2020-03-28 MED ORDER — PAROXETINE HCL 10 MG PO TABS: 10.0000 mg | ORAL_TABLET | Freq: Every day | ORAL | 1 refills | Status: DC

## 2020-03-28 NOTE — Patient Instructions (Signed)
Please return in 4 weeks to recheck mood and focus.   Take the medications as directed.   Please call [facility] to schedule an appointment with Dr. Trey Paula; she is a therapist here at our [place] office.  The phone number is: [phone]   If you have any questions or concerns, please don't hesitate to send me a message via MyChart or call the office at [phone]. Thank you for visiting with Anne Humphrey today! It's our pleasure caring for you.

## 2020-03-28 NOTE — Progress Notes (Signed)
Subjective  CC:  Chief Complaint  Patient presents with  . Depression    And anxiety    HPI: Anne Humphrey is a 40 y.o. female who presents to the office today to address the problems listed above in the chief complaint, mood problems.  She has a 41 year old female, mother of 4 children, married who presents for anxiety symptoms.  She has history of PMDD.  Had been on Lexapro and Zoloft in the past.  Last was here almost 3 years ago.  She complains that she feels overwhelmed.  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  One has Down syndrome.  She has a struggling marriage.  Her husband is struggling with addiction.  She reports is hard to stay on task, hard to stay focused.  Reports she struggles to get things done.  Always feels off task.  She is always exhausted.  Sleeps well.  Never has an appetite and feels chronically anxious.  Her depressive and anxiety screens are both very positive.  She has never been diagnosed with ADD but wonders if that is contributing to her problems.  She struggled keeping on task just to make this appointment.  She has not been to counseling.  She has no suicidal ideation.  No true panic attacks.   Depression screen [facility] 2/9 03/28/2020 08/29/2017  Decreased Interest 1 0  Down, Depressed, Hopeless 0 0  PHQ - 2 Score 1 0  Altered sleeping 0 -  Tired, decreased energy 3 -  Change in appetite 3 -  Feeling bad or failure about yourself  3 -  Trouble concentrating 3 -  Moving slowly or fidgety/restless 2 -  Suicidal thoughts 0 -  PHQ-9 Score 15 -  Difficult doing work/chores Very difficult -   GAD 7 : Generalized Anxiety Score 03/28/2020  Nervous, Anxious, on Edge 2  Control/stop worrying 1  Worry too much - different things 1  Trouble relaxing 3  Restless 0  Easily annoyed or irritable 3  Afraid - awful might happen 0  Total GAD 7 Score 10  Anxiety Difficulty Very difficult   On further therapy appreciated Assessment  1. GAD (generalized anxiety disorder)    2. Attention deficit   3. Marital conflict      Plan   Anxiety and poor attention: Difficult and complicated.  She is in a struggling marriage which could be the main problem but could also have underlying ADD/anxiety and/or depression.  We discussed all of the above.  Recommend counseling, trial of ADD medication and Paxil at bedtime.  Close follow-up.  Recheck 4 weeks.  Discussed expectations of medications and possible risks and side effects.  Next visit was 30 minutes in total with counseling and face-to-face intervention discussed.  Follow up: 4 weeks to recheck No orders of the defined types were placed in this encounter.  Meds ordered this encounter  Medications  . amphetamine-dextroamphetamine (ADDERALL XR) 10 MG 24 hr capsule    Sig: Take 1 capsule (10 mg total) by mouth daily.    Dispense:  30 capsule    Refill:  0  . PARoxetine (PAXIL) 10 MG tablet    Sig: Take 1 tablet (10 mg total) by mouth at bedtime.    Dispense:  30 tablet    Refill:  1      I reviewed the patients updated PMH, FH, and SocHx.    Patient Active Problem List   Diagnosis Date Noted  . History of gestational diabetes 08/29/2017  . Allergic asthma  08/29/2017  . Seasonal allergic rhinitis 08/29/2017  . PMDD (premenstrual dysphoric disorder) 08/29/2017   Current Meds  Medication Sig  . albuterol (PROVENTIL HFA;VENTOLIN HFA) 108 (90 BASE) MCG/ACT inhaler Inhale 2 puffs into the lungs every 6 (six) hours as needed for wheezing.  Marland Kitchen amphetamine-dextroamphetamine (ADDERALL XR) 10 MG 24 hr capsule Take 1 capsule (10 mg total) by mouth daily.  . fluticasone (FLONASE) 50 MCG/ACT nasal spray Place 2 sprays into both nostrils daily.  Marland Kitchen PARoxetine (PAXIL) 10 MG tablet Take 1 tablet (10 mg total) by mouth at bedtime.  . Peak Flow Meter DEVI Use as directed    Allergies: Patient has No Known Allergies. Family history:  Patient family history includes Cancer in her maternal grandmother; Diabetes in her  maternal grandfather; Down syndrome in her daughter; Heart disease in her maternal grandfather; Leukemia in her maternal grandmother; Restless legs syndrome in her father and mother; Speech disorder in her daughter; Stroke in her paternal grandfather. Social History   Socioeconomic History  . Marital status: Married    Spouse name: Not on file  . Number of children: 4  . Years of education: Not on file  . Highest education level: Not on file  Occupational History  . 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 Smoking status: Never Smoker  . Smokeless tobacco: Never Used  Vaping Use  . Vaping Use: Never used  Substance and Sexual Activity  . Alcohol use: Yes    Comment: rarely  . Drug use: No  . Sexual activity: Yes    Birth control/protection: Other-see comments    Comment: Husband had a vasectomy  Other Topics Concern  . Not on file  Social History Narrative  . Not on file   Social Determinants of Health   Financial Resource Strain: Not on file  Food Insecurity: Not on file  Transportation Needs: Not on file  Physical Activity: Not on file  Stress: Not on file  Social Connections: Not on file     Review of Systems: Constitutional: Negative for fever malaise or anorexia Cardiovascular: negative for chest pain Respiratory: negative for SOB or persistent cough Gastrointestinal: negative for abdominal pain  Objective  Vitals: BP 108/70 (BP Location: Right Arm, Patient Position: Sitting, Cuff Size: Normal)   Pulse 78   Temp 98.6 F (37 C) (Temporal)   Ht 5\' 4"  (1.626 m)   Wt 125 lb (56.7 kg)   SpO2 98%   BMI 21.46 kg/m  General: no acute distress, well appearing, no apparent distress, well groomed, thin Psych:  Alert and oriented x 3,anxious.  Clear insight, normal speech Cardiovascular:  RRR without murmur or gallop. no peripheral edema Respiratory:  Good breath sounds bilaterally, CTAB with normal respiratory effort Skin:  Warm, no  rashes    Commons side effects, risks, benefits, and alternatives for medications and treatment plan prescribed today were discussed, and the patient expressed understanding of the given instructions. Patient is instructed to call or message via MyChart if he/she has any questions or concerns regarding our treatment plan. No barriers to understanding were identified. We discussed Red Flag symptoms and signs in detail. Patient expressed understanding regarding what to do in case of urgent or emergency type symptoms.   Medication list was reconciled, printed and provided to the patient in AVS. Patient instructions and summary information was reviewed with the patient as documented in the AVS. This note was prepared with assistance of Dragon voice recognition software. Occasional wrong-word or sound-a-like  substitutions may have occurred due to the inherent limitations of voice recognition software

## 2020-04-19 ENCOUNTER — Other Ambulatory Visit: Payer: Self-pay | Admitting: Family Medicine

## 2020-04-26 ENCOUNTER — Encounter: Payer: Self-pay | Admitting: Family Medicine

## 2020-04-26 ENCOUNTER — Ambulatory Visit (INDEPENDENT_AMBULATORY_CARE_PROVIDER_SITE_OTHER): Payer: BC Managed Care – PPO | Admitting: Family Medicine

## 2020-04-26 ENCOUNTER — Other Ambulatory Visit: Payer: Self-pay

## 2020-04-26 VITALS — BP 112/80 | HR 69 | Temp 98.4°F | Wt 117.6 lb

## 2020-04-26 DIAGNOSIS — F411 Generalized anxiety disorder: Secondary | ICD-10-CM

## 2020-04-26 DIAGNOSIS — R41840 Attention and concentration deficit: Secondary | ICD-10-CM | POA: Diagnosis not present

## 2020-04-26 DIAGNOSIS — L301 Dyshidrosis [pompholyx]: Secondary | ICD-10-CM

## 2020-04-26 MED ORDER — AMPHETAMINE-DEXTROAMPHET ER 10 MG PO CP24: 10.0000 mg | ORAL_CAPSULE | Freq: Every day | ORAL | 0 refills | Status: DC

## 2020-04-26 MED ORDER — PAROXETINE HCL 20 MG PO TABS: 20.0000 mg | ORAL_TABLET | Freq: Every day | ORAL | 1 refills | Status: DC

## 2020-04-26 MED ORDER — TRIAMCINOLONE ACETONIDE 0.1 % EX CREA: 1.0000 "application " | TOPICAL_CREAM | Freq: Two times a day (BID) | CUTANEOUS | 0 refills | Status: DC

## 2020-04-26 NOTE — Progress Notes (Signed)
Subjective  CC:  Chief Complaint  Patient presents with  . Anxiety    Huge improvement from last visit while taking Paxil and Adderall     HPI: Anne Humphrey is a 40 y.o. female who presents to the office today to address the problems listed above in the chief complaint, mood problems.  This is short-term follow-up for anxiety and mood concerns and poor focus.  See last note.  Complicated situation with stressful home life, homeschooling 4 children one with Down syndrome, marital conflict with husband who struggles with addiction issues, and history of anxiety issues in the past.  We decided to start Adderall 10 mg daily and Paxil 10 mg daily.  She reports she is doing significantly better.  She noticed after a few days of the Adderall significantly helped her focus more and she is now feeling that she accomplishes things.  She remains scattered but certainly less worried, irritable and hyper reactive.  She is no longer having panic symptoms.  Her mood is elevated with the Paxil.  She is sleeping well.  Appetite is fair.  She has an appointment scheduled with therapist next week.  Her husband is also getting some help and is now on some medication.  She is feeling hopeful.    Complains of itchy rash on her hands.  Has outbreaks described as small bumps that will itch.  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  She has multiple abrasions on her hands.  No other rashes elsewhere.   Depression screen [facility] 2/9 04/26/2020 03/28/2020 08/29/2017  Decreased Interest 0 1 0  Down, Depressed, Hopeless 0 0 0  PHQ - 2 Score 0 1 0  Altered sleeping 0 0 -  Tired, decreased energy 1 3 -  Change in appetite 1 3 -  Feeling bad or failure about yourself  0 3 -  Trouble concentrating 1 3 -  Moving slowly or fidgety/restless 0 2 -  Suicidal thoughts 0 0 -  PHQ-9 Score 3 15 -  Difficult doing work/chores - Very difficult -   GAD 7 : Generalized Anxiety Score 04/26/2020 03/28/2020  Nervous, Anxious, on Edge 1 2   Control/stop worrying 0 1  Worry too much - different things 1 1  Trouble relaxing 1 3  Restless 0 0  Easily annoyed or irritable 1 3  Afraid - awful might happen 0 0  Total GAD 7 Score 4 10  Anxiety Difficulty - Very difficult    Assessment  1. GAD (generalized anxiety disorder)   2. Attention deficit   3. Dyshidrotic eczema      Plan   Adjustment disorder with anxiety or generalized anxiety disorder with possible ADD: Counseling education given.  Hard to make definitive diagnosis this time.  She is doing much better.  In part due to medications were also in part due to actively working on getting help.  I agree with psychotherapy.  We will continue Adderall 10 mg daily and increase Paxil to 20 mg daily.  Education on proper use of medications, expectations and risks versus benefits discussed in detail.  Recheck in 3 months. Her weight is down; will monitor. Reports appetite is fine.  Dyshidrotic eczema with infection: Educated on skin care.  Recommend Neosporin for the next week.  Then use triamcinolone steroid cream as needed for redness or itching bumps.  Avoid picking.  Follow-up as needed. MDM: 3 problems with unclear prognosis and RX drug mgt with high risk medication.   Follow  up: 3 months for complete physical and ID recheck. No orders of the defined types were placed in this encounter.  Meds ordered this encounter  Medications  . PARoxetine (PAXIL) 20 MG tablet    Sig: Take 1 tablet (20 mg total) by mouth daily.    Dispense:  90 tablet    Refill:  1  . amphetamine-dextroamphetamine (ADDERALL XR) 10 MG 24 hr capsule    Sig: Take 1 capsule (10 mg total) by mouth daily.    Dispense:  30 capsule    Refill:  0  . amphetamine-dextroamphetamine (ADDERALL XR) 10 MG 24 hr capsule    Sig: Take 1 capsule (10 mg total) by mouth daily.    Dispense:  30 capsule    Refill:  0  . amphetamine-dextroamphetamine (ADDERALL XR) 10 MG 24 hr capsule    Sig: Take 1 capsule (10 mg total)  by mouth daily.    Dispense:  30 capsule    Refill:  0  . triamcinolone (KENALOG) 0.1 %    Sig: Apply 1 application topically 2 (two) times daily. For 2 weeks, then as needed    Dispense:  45 g    Refill:  0      I reviewed the patients updated PMH, FH, and SocHx.    Patient Active Problem List   Diagnosis Date Noted  . GAD (generalized anxiety disorder) 04/26/2020    Priority: High  . Attention deficit 04/26/2020    Priority: High  . History of gestational diabetes 08/29/2017    Priority: Medium  . Allergic asthma 08/29/2017    Priority: Medium  . PMDD (premenstrual dysphoric disorder) 08/29/2017    Priority: Medium  . Dyshidrotic eczema 04/26/2020    Priority: Low  . Seasonal allergic rhinitis 08/29/2017    Priority: Low   Current Meds  Medication Sig  . albuterol (PROVENTIL HFA;VENTOLIN HFA) 108 (90 BASE) MCG/ACT inhaler Inhale 2 puffs into the lungs every 6 (six) hours as needed for wheezing.  . fluticasone (FLONASE) 50 MCG/ACT nasal spray Place 2 sprays into both nostrils daily.  . Peak Flow Meter DEVI Use as directed  . triamcinolone (KENALOG) 0.1 % Apply 1 application topically 2 (two) times daily. For 2 weeks, then as needed  . [DISCONTINUED] amphetamine-dextroamphetamine (ADDERALL XR) 10 MG 24 hr capsule Take 1 capsule (10 mg total) by mouth daily.  . [DISCONTINUED] PARoxetine (PAXIL) 10 MG tablet TAKE 1 TABLET BY MOUTH EVERYDAY AT BEDTIME    Allergies: Patient has No Known Allergies. Family history:  Patient family history includes Cancer in her maternal grandmother; Diabetes in her maternal grandfather; Down syndrome in her daughter; Heart disease in her maternal grandfather; Leukemia in her maternal grandmother; Restless legs syndrome in her father and mother; Speech disorder in her daughter; Stroke in her paternal grandfather. Social History   Socioeconomic History  . Marital status: Married    Spouse name: Not on file  . Number of children: 4  . Years of  education: Not on file  . Highest education level: Not on file  Occupational History  . 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 Smoking status: Never Smoker  . Smokeless tobacco: Never Used  Vaping Use  . Vaping Use: Never used  Substance and Sexual Activity  . Alcohol use: Yes    Comment: rarely  . Drug use: No  . Sexual activity: Yes    Birth control/protection: Other-see comments    Comment: Husband had a vasectomy  Other  Topics Concern  . Not on file  Social History Narrative  . Not on file   Social Determinants of Health   Financial Resource Strain: Not on file  Food Insecurity: Not on file  Transportation Needs: Not on file  Physical Activity: Not on file  Stress: Not on file  Social Connections: Not on file     Review of Systems: Constitutional: Negative for fever malaise or anorexia Cardiovascular: negative for chest pain Respiratory: negative for SOB or persistent cough Gastrointestinal: negative for abdominal pain  Objective  Vitals: BP 112/80   Pulse 69   Temp 98.4 F (36.9 C) (Temporal)   Wt 117 lb 9.6 oz (53.3 kg)   SpO2 98%   BMI 20.19 kg/m  General: no acute distress, well appearing, no apparent distress, well groomed Psych:  Alert and oriented x 3,normal mood, behavior, speech, dress, and thought processes. Appears much calmer and brighter today.  Skin:  Warm, no rashes  Wt Readings from Last 3 Encounters:  04/26/20 117 lb 9.6 oz (53.3 kg)  03/28/20 125 lb (56.7 kg)  10/10/17 113 lb 12.8 oz (51.6 kg)     Commons side effects, risks, benefits, and alternatives for medications and treatment plan prescribed today were discussed, and the patient expressed understanding of the given instructions. Patient is instructed to call or message via MyChart if he/she has any questions or concerns regarding our treatment plan. No barriers to understanding were identified. We discussed Red Flag symptoms and signs in detail. Patient expressed  understanding regarding what to do in case of urgent or emergency type symptoms.   Medication list was reconciled, printed and provided to the patient in AVS. Patient instructions and summary information was reviewed with the patient as documented in the AVS. This note was prepared with assistance of Dragon voice recognition software. Occasional wrong-word or sound-a-like substitutions may have occurred due to the inherent limitations of voice recognition software

## 2020-04-26 NOTE — Patient Instructions (Signed)
Please return in 3 months for your annual complete physical; please come fasting. And to recheck mood/attention.  If you have any questions or concerns, please don't hesitate to send me a message via MyChart or call the office at [phone]. Thank you for visiting with [place] today! It's our pleasure caring for you.

## 2020-05-02 ENCOUNTER — Ambulatory Visit: Payer: BC Managed Care – PPO | Admitting: Physician Assistant

## 2020-05-02 ENCOUNTER — Ambulatory Visit (INDEPENDENT_AMBULATORY_CARE_PROVIDER_SITE_OTHER): Payer: BC Managed Care – PPO | Admitting: Psychology

## 2020-05-02 DIAGNOSIS — F411 Generalized anxiety disorder: Secondary | ICD-10-CM | POA: Diagnosis not present

## 2020-05-09 ENCOUNTER — Ambulatory Visit (INDEPENDENT_AMBULATORY_CARE_PROVIDER_SITE_OTHER): Payer: BC Managed Care – PPO | Admitting: Psychology

## 2020-05-09 DIAGNOSIS — F411 Generalized anxiety disorder: Secondary | ICD-10-CM

## 2020-05-23 ENCOUNTER — Ambulatory Visit (INDEPENDENT_AMBULATORY_CARE_PROVIDER_SITE_OTHER): Payer: BC Managed Care – PPO | Admitting: Psychology

## 2020-05-23 DIAGNOSIS — F411 Generalized anxiety disorder: Secondary | ICD-10-CM | POA: Diagnosis not present

## 2020-05-30 ENCOUNTER — Ambulatory Visit (INDEPENDENT_AMBULATORY_CARE_PROVIDER_SITE_OTHER): Payer: BC Managed Care – PPO | Admitting: Psychology

## 2020-05-30 DIAGNOSIS — F411 Generalized anxiety disorder: Secondary | ICD-10-CM

## 2020-06-01 ENCOUNTER — Ambulatory Visit (INDEPENDENT_AMBULATORY_CARE_PROVIDER_SITE_OTHER): Payer: BC Managed Care – PPO | Admitting: Psychology

## 2020-06-01 DIAGNOSIS — F411 Generalized anxiety disorder: Secondary | ICD-10-CM

## 2020-06-06 ENCOUNTER — Ambulatory Visit (INDEPENDENT_AMBULATORY_CARE_PROVIDER_SITE_OTHER): Payer: BC Managed Care – PPO | Admitting: Psychology

## 2020-06-06 DIAGNOSIS — F411 Generalized anxiety disorder: Secondary | ICD-10-CM | POA: Diagnosis not present

## 2020-07-07 ENCOUNTER — Ambulatory Visit (INDEPENDENT_AMBULATORY_CARE_PROVIDER_SITE_OTHER): Payer: BC Managed Care – PPO

## 2020-07-07 ENCOUNTER — Encounter: Payer: Self-pay | Admitting: Family Medicine

## 2020-07-07 ENCOUNTER — Other Ambulatory Visit: Payer: Self-pay

## 2020-07-07 ENCOUNTER — Ambulatory Visit (INDEPENDENT_AMBULATORY_CARE_PROVIDER_SITE_OTHER): Payer: BC Managed Care – PPO | Admitting: Family Medicine

## 2020-07-07 VITALS — BP 127/82 | HR 68 | Temp 98.5°F | Ht 64.0 in | Wt 116.6 lb

## 2020-07-07 DIAGNOSIS — K582 Mixed irritable bowel syndrome: Secondary | ICD-10-CM | POA: Diagnosis not present

## 2020-07-07 DIAGNOSIS — R140 Abdominal distension (gaseous): Secondary | ICD-10-CM | POA: Diagnosis not present

## 2020-07-07 DIAGNOSIS — N941 Unspecified dyspareunia: Secondary | ICD-10-CM

## 2020-07-07 DIAGNOSIS — I8392 Asymptomatic varicose veins of left lower extremity: Secondary | ICD-10-CM

## 2020-07-07 LAB — COMPREHENSIVE METABOLIC PANEL
ALT: 38 U/L — ABNORMAL HIGH (ref 0–35)
AST: 37 U/L (ref 0–37)
Albumin: 3.8 g/dL (ref 3.5–5.2)
Alkaline Phosphatase: 60 U/L (ref 39–117)
BUN: 8 mg/dL (ref 6–23)
CO2: 27 mEq/L (ref 19–32)
Calcium: 8.5 mg/dL (ref 8.4–10.5)
Chloride: 105 mEq/L (ref 96–112)
Creatinine, Ser: 0.62 mg/dL (ref 0.40–1.20)
GFR: 111.86 mL/min (ref >60.00)
Glucose, Bld: 94 mg/dL (ref 70–99)
Potassium: 4 mEq/L (ref 3.5–5.1)
Sodium: 138 mEq/L (ref 135–145)
Total Bilirubin: 0.4 mg/dL (ref 0.2–1.2)
Total Protein: 6.1 g/dL (ref 6.0–8.3)

## 2020-07-07 LAB — CBC WITH DIFFERENTIAL/PLATELET
Basophils Absolute: 0 10*3/uL (ref 0.0–0.1)
Basophils Relative: 0.4 % (ref 0.0–3.0)
Eosinophils Absolute: 0.1 10*3/uL (ref 0.0–0.7)
Eosinophils Relative: 2.4 % (ref 0.0–5.0)
HCT: 31.9 % — ABNORMAL LOW (ref 36.0–46.0)
Hemoglobin: 10.5 g/dL — ABNORMAL LOW (ref 12.0–15.0)
Lymphocytes Relative: 25.5 % (ref 12.0–46.0)
Lymphs Abs: 1.4 10*3/uL (ref 0.7–4.0)
MCHC: 33.1 g/dL (ref 30.0–36.0)
MCV: 82.8 fl (ref 78.0–100.0)
Monocytes Absolute: 0.5 10*3/uL (ref 0.1–1.0)
Monocytes Relative: 8.2 % (ref 3.0–12.0)
Neutro Abs: 3.5 10*3/uL (ref 1.4–7.7)
Neutrophils Relative %: 63.5 % (ref 43.0–77.0)
Platelets: 224 10*3/uL (ref 150.0–400.0)
RBC: 3.85 Mil/uL — ABNORMAL LOW (ref 3.87–5.11)
RDW: 17.1 % — ABNORMAL HIGH (ref 11.5–15.5)
WBC: 5.6 10*3/uL (ref 4.0–10.5)

## 2020-07-07 LAB — TSH: TSH: 2.74 u[IU]/mL (ref 0.35–4.50)

## 2020-07-07 LAB — LIPASE: Lipase: 11 U/L (ref 11.0–59.0)

## 2020-07-07 NOTE — Patient Instructions (Signed)
Please follow up as scheduled for your next visit with me: 08/19/2020   If you have any questions or concerns, please don't hesitate to send me a message via MyChart or call the office at [phone]. Thank you for visiting with [place] today! It's our pleasure caring for you.   This is possibly constipation due to IBS. Read info below. Look at the fodmap diet to see if that helps. Start otc colace daily to keep stool soft and Miralax once a day to see if this helps you go to the bathroom more regularly. If things get too soft, loose or more frequent, then decrease the medication frequency.

## 2020-07-07 NOTE — Progress Notes (Signed)
Subjective  CC:  Chief Complaint  Patient presents with   Bloated    Thinks she has a hernia, started Saturday    HPI: Anne Humphrey is a 40 y.o. female who presents to the office today to address the problems listed above in the chief complaint. Patient presents due to abdominal bloating.  She reports over the last week she has had significant abdominal bloating and some mild discomfort.  Mild cramping.  She reports a long history of alternating bowel habits between constipation and loose stools.  She is never been diagnosed with IBS.  More recently, she has been suffering with constipation.  She did do an enema last night which helped relieve some of her symptoms.  Her abdomen is now more flat and she has less discomfort.  She feels something by her bellybutton and wonders if this is a hernia.  She also reports that she is recently working with a vein specialist for her lower extremity varicosities travel towards her perineum.  During ultrasound, she reportedly saw a possible fibroid or endometrial polyp.  She will follow-up with GYN for further verification.  Review of systems is positive for pelvic pain with intercourse.  She denies fevers, chills, nausea or vomiting.  She has no upper GI symptoms.  She reports her diet is mainly sandwiches at this time and healthy.  She does not take medications for constipation routinely.  She has noticed blood in her stool.  No weight changes.  Assessment  1. Abdominal bloating   2. Irritable bowel syndrome with both constipation and diarrhea   3. Varicose veins of left lower extremity, unspecified whether complicated   4. Dyspareunia, female      Plan  Abdominal bloating constipation: Possibly related to IBS.  Education given.  See after visit summary for further education with FODMAP diet.  We will try Colace and MiraLAX.  Check KUB.  Reassured.  No red flag symptoms currently.  Check lab work.  We will follow-up at next appointment with further  evaluation if needed at that time. Varicose veins: Follow-up with vascular Discussed painful intercourse and possible fibroid or endometrial polyp.  She has an appointment scheduled with GYN for further evaluation.  Doubt related to her current abdominal symptoms.  Follow up: As scheduled for 08/19/2020  Orders Placed This Encounter  Procedures   DG Abd 1 View   Comprehensive metabolic panel   CBC with Differential/Platelet   Lipase   TSH   No orders of the defined types were placed in this encounter.     I reviewed the patients updated PMH, FH, and SocHx.    Patient Active Problem List   Diagnosis Date Noted   GAD (generalized anxiety disorder) 04/26/2020    Priority: High   Attention deficit 04/26/2020    Priority: High   History of gestational diabetes 08/29/2017    Priority: Medium   Allergic asthma 08/29/2017    Priority: Medium   PMDD (premenstrual dysphoric disorder) 08/29/2017    Priority: Medium   Dyshidrotic eczema 04/26/2020    Priority: Low   Seasonal allergic rhinitis 08/29/2017    Priority: Low   Current Meds  Medication Sig   albuterol (PROVENTIL HFA;VENTOLIN HFA) 108 (90 BASE) MCG/ACT inhaler Inhale 2 puffs into the lungs every 6 (six) hours as needed for wheezing.   amphetamine-dextroamphetamine (ADDERALL XR) 10 MG 24 hr capsule Take 1 capsule (10 mg total) by mouth daily.   amphetamine-dextroamphetamine (ADDERALL XR) 10 MG 24 hr capsule Take 1  capsule (10 mg total) by mouth daily.   amphetamine-dextroamphetamine (ADDERALL XR) 10 MG 24 hr capsule Take 1 capsule (10 mg total) by mouth daily.   PARoxetine (PAXIL) 20 MG tablet Take 1 tablet (20 mg total) by mouth daily.    Allergies: Patient has No Known Allergies. Family History: Patient family history includes Cancer in her maternal grandmother; Diabetes in her maternal grandfather; Down syndrome in her daughter; Heart disease in her maternal grandfather; Leukemia in her maternal grandmother; Restless  legs syndrome in her father and mother; Speech disorder in her daughter; Stroke in her paternal grandfather. Social History:  Patient  reports that she has never smoked. She has never used smokeless tobacco. She reports current alcohol use. She reports that she does not use drugs.  Review of Systems: Constitutional: Negative for fever malaise or anorexia Cardiovascular: negative for chest pain Respiratory: negative for SOB or persistent cough Gastrointestinal: negative for abdominal pain  Objective  Vitals: BP 127/82   Pulse 68   Temp 98.5 F (36.9 C) (Temporal)   Ht 5\' 4"  (1.626 m)   Wt 116 lb 9.6 oz (52.9 kg)   LMP 07/05/2020 (Exact Date)   SpO2 98%   BMI 20.01 kg/m  General: no acute distress , A&Ox3 HEENT: PEERL, conjunctiva normal, neck is supple Cardiovascular:  RRR without murmur or gallop.  Respiratory:  Good breath sounds bilaterally, CTAB with normal respiratory effort Gastrointestinal: soft, flat abdomen, normal active bowel sounds, no palpable masses, no hepatosplenomegaly, no appreciated hernias Skin:  Warm, no rashes    Commons side effects, risks, benefits, and alternatives for medications and treatment plan prescribed today were discussed, and the patient expressed understanding of the given instructions. Patient is instructed to call or message via MyChart if he/she has any questions or concerns regarding our treatment plan. No barriers to understanding were identified. We discussed Red Flag symptoms and signs in detail. Patient expressed understanding regarding what to do in case of urgent or emergency type symptoms.  Medication list was reconciled, printed and provided to the patient in AVS. Patient instructions and summary information was reviewed with the patient as documented in the AVS. This note was prepared with assistance of Dragon voice recognition software. Occasional wrong-word or sound-a-like substitutions may have occurred due to the inherent limitations  of voice recognition software  This visit occurred during the SARS-CoV-2 public health emergency.  Safety protocols were in place, including screening questions prior to the visit, additional usage of staff PPE, and extensive cleaning of exam room while observing appropriate contact time as indicated for disinfecting solutions.

## 2020-07-19 ENCOUNTER — Telehealth: Payer: Self-pay

## 2020-07-19 NOTE — Telephone Encounter (Signed)
Attempted to contact [facility]. No current option to speak with pharmacy staff. I will attempt this afternoon

## 2020-07-19 NOTE — Telephone Encounter (Signed)
Patient called in and stated when she got her last refill on her Adderall [facility] only had [facility] pills and she suppose to have 30 patient wanted to know if the other 10 could be sent to  [facility], [place] - [address] [place] [address] [place] [address] [place] [address] Phone:  [phone]  Fax:  [phone]

## 2020-07-19 NOTE — Telephone Encounter (Signed)
Attempted to contact [facility] again. They are closed for lunch.

## 2020-07-21 ENCOUNTER — Other Ambulatory Visit: Payer: Self-pay

## 2020-07-21 MED ORDER — AMPHETAMINE-DEXTROAMPHET ER 10 MG PO CP24: 10.0000 mg | ORAL_CAPSULE | Freq: Every day | ORAL | 0 refills | Status: DC

## 2020-07-21 NOTE — Telephone Encounter (Signed)
Can you please send in #10,0 script for patient? Verified with [facility] that medication was on backorder. Patient opted to fill #20 instead of having script filled elsewhere.

## 2020-07-21 NOTE — Telephone Encounter (Signed)
Can we send in the remaining 10 pills to [place], [place] - [address] [place] [address] [place] [address] since we were unable to get in contact with [facility] regarding this  patient is now completely out of pills .

## 2020-07-21 NOTE — Telephone Encounter (Signed)
Verified with pharmacy that medication was on back order and patient opted to fill #20,0 instead of transferring script elsewhere. I have sent refill request for #10,0 to PCP

## 2020-07-25 ENCOUNTER — Other Ambulatory Visit: Payer: Self-pay | Admitting: Family Medicine

## 2020-07-29 ENCOUNTER — Other Ambulatory Visit: Payer: Self-pay | Admitting: Family Medicine

## 2020-08-02 MED ORDER — AMPHETAMINE-DEXTROAMPHET ER 10 MG PO CP24: 10.0000 mg | ORAL_CAPSULE | Freq: Every day | ORAL | 0 refills | Status: DC

## 2020-08-04 ENCOUNTER — Ambulatory Visit (INDEPENDENT_AMBULATORY_CARE_PROVIDER_SITE_OTHER): Payer: BC Managed Care – PPO | Admitting: Psychology

## 2020-08-04 DIAGNOSIS — F411 Generalized anxiety disorder: Secondary | ICD-10-CM | POA: Diagnosis not present

## 2020-08-10 ENCOUNTER — Ambulatory Visit: Payer: BC Managed Care – PPO | Admitting: Psychology

## 2020-08-17 ENCOUNTER — Ambulatory Visit: Payer: BC Managed Care – PPO | Admitting: Psychology

## 2020-08-19 ENCOUNTER — Other Ambulatory Visit: Payer: Self-pay

## 2020-08-19 ENCOUNTER — Ambulatory Visit (INDEPENDENT_AMBULATORY_CARE_PROVIDER_SITE_OTHER): Payer: BC Managed Care – PPO | Admitting: Family Medicine

## 2020-08-19 ENCOUNTER — Encounter: Payer: Self-pay | Admitting: Family Medicine

## 2020-08-19 VITALS — BP 135/90 | HR 70 | Temp 98.0°F | Wt 111.4 lb

## 2020-08-19 DIAGNOSIS — D649 Anemia, unspecified: Secondary | ICD-10-CM | POA: Diagnosis not present

## 2020-08-19 DIAGNOSIS — K581 Irritable bowel syndrome with constipation: Secondary | ICD-10-CM | POA: Insufficient documentation

## 2020-08-19 DIAGNOSIS — L301 Dyshidrosis [pompholyx]: Secondary | ICD-10-CM | POA: Diagnosis not present

## 2020-08-19 DIAGNOSIS — R41840 Attention and concentration deficit: Secondary | ICD-10-CM | POA: Diagnosis not present

## 2020-08-19 DIAGNOSIS — F411 Generalized anxiety disorder: Secondary | ICD-10-CM

## 2020-08-19 DIAGNOSIS — F3281 Premenstrual dysphoric disorder: Secondary | ICD-10-CM

## 2020-08-19 DIAGNOSIS — Z0000 Encounter for general adult medical examination without abnormal findings: Secondary | ICD-10-CM | POA: Diagnosis not present

## 2020-08-19 LAB — CBC WITH DIFFERENTIAL/PLATELET
Basophils Absolute: 0 10*3/uL (ref 0.0–0.1)
Basophils Relative: 0.3 % (ref 0.0–3.0)
Eosinophils Absolute: 0.1 10*3/uL (ref 0.0–0.7)
Eosinophils Relative: 1.3 % (ref 0.0–5.0)
HCT: 36.3 % (ref 36.0–46.0)
Hemoglobin: 11.8 g/dL — ABNORMAL LOW (ref 12.0–15.0)
Lymphocytes Relative: 23.1 % (ref 12.0–46.0)
Lymphs Abs: 1.2 10*3/uL (ref 0.7–4.0)
MCHC: 32.5 g/dL (ref 30.0–36.0)
MCV: 82.6 fl (ref 78.0–100.0)
Monocytes Absolute: 0.4 10*3/uL (ref 0.1–1.0)
Monocytes Relative: 8.2 % (ref 3.0–12.0)
Neutro Abs: 3.5 10*3/uL (ref 1.4–7.7)
Neutrophils Relative %: 67.1 % (ref 43.0–77.0)
Platelets: 254 10*3/uL (ref 150.0–400.0)
RBC: 4.4 Mil/uL (ref 3.87–5.11)
RDW: 17.1 % — ABNORMAL HIGH (ref 11.5–15.5)
WBC: 5.3 10*3/uL (ref 4.0–10.5)

## 2020-08-19 LAB — LIPID PANEL
Cholesterol: 177 mg/dL (ref 0–200)
HDL: 57.2 mg/dL (ref >39.00)
LDL Cholesterol: 109 mg/dL — ABNORMAL HIGH (ref 0–99)
NonHDL: 119.82
Total CHOL/HDL Ratio: 3
Triglycerides: 56 mg/dL (ref 0.0–149.0)
VLDL: 11.2 mg/dL (ref 0.0–40.0)

## 2020-08-19 LAB — B12 AND FOLATE PANEL
Folate: 5.7 ng/mL — ABNORMAL LOW (ref >5.9)
Vitamin B-12: 225 pg/mL (ref 211–911)

## 2020-08-19 MED ORDER — BETAMETHASONE DIPROPIONATE 0.05 % EX CREA: TOPICAL_CREAM | CUTANEOUS | 0 refills | Status: AC

## 2020-08-19 MED ORDER — PAROXETINE HCL 40 MG PO TABS: 40.0000 mg | ORAL_TABLET | Freq: Every day | ORAL | 3 refills | Status: DC

## 2020-08-19 MED ORDER — AMPHETAMINE-DEXTROAMPHET ER 10 MG PO CP24: 10.0000 mg | ORAL_CAPSULE | Freq: Every day | ORAL | 0 refills | Status: DC

## 2020-08-19 NOTE — Progress Notes (Signed)
Subjective  Chief Complaint  Patient presents with   Annual Exam    Cup of coffee with milk for breakfast    Eczema    Bilateral hands and feet    Anxiety    Wanting to increase paxil dosage for to help with her skin picking    Constipation    BM 1-2 times weekly    HPI: Anne Humphrey is a 40 y.o. female who presents to [facility] today for a Female Wellness Visit.  She also has the concerns and/or needs as listed above in the chief complaint. These will be addressed in addition to the Health Maintenance Visit.   Wellness Visit: annual visit with health maintenance review and exam without Pap  Health maintenance: Screens are up-to-date.  Immunizations are up-to-date.  Nonfasting today Chronic disease management visit and/or acute problem visit: Constipation, abdominal bloating: See last visit.  We started MiraLAX and Colace.  Lab work was unremarkable, x-ray showed moderate fecal burden.  Patient has used enemas which has helped relieve her constipation.  She reports a long history of chronic constipation, usually going to the bathroom no more than once to twice weekly.  Her abdominal bloating has resolved.  She is following partial FODMAP diet.  She has identified some triggers.  She likely has IBS with constipation predominance.  No further pain.  No hard firm stools.  No blood in the stool. General anxiety and attention deficit: She feels that Prozac has helped considerably.  She wonders if increased dose would help her chronic picking.  Adderall continues to help with focus.  No adverse effects. .Complains of dyshidrotic eczema flare.  Very itchy.  She tends to scratch until excoriation.  She has seen dermatologist but forgot to bring it up.  She has triamcinolone cream but this has not been extremely helpful.  No new symptoms.  Symptoms are mainly in hands and feet Normocytic anemia: Admits to heavy periods.  She is seeing GYN next week for wellness exam,  follow-up for painful intercourse and heavy menses.  Assessment  1. Annual physical exam   2. GAD (generalized anxiety disorder)   3. Attention deficit   4. PMDD (premenstrual dysphoric disorder)   5. Dyshidrotic eczema   6. Normocytic anemia   7. Irritable bowel syndrome with constipation      Plan  Female Wellness Visit: Age appropriate Health Maintenance and Prevention measures were discussed with patient. Included topics are cancer screening recommendations, ways to keep healthy (see AVS) including dietary and exercise recommendations, regular eye and dental care, use of seat belts, and avoidance of moderate alcohol use and tobacco use.  BMI: discussed patient's BMI and encouraged positive lifestyle modifications to help get to or maintain a target BMI. HM needs and immunizations were addressed and ordered. See below for orders. See HM and immunization section for updates. Routine labs and screening tests ordered including cmp, cbc and lipids where appropriate. Discussed recommendations regarding Vit D and calcium supplementation (see AVS)  Chronic disease f/u and/or acute problem visit: (deemed necessary to be done in addition to the wellness visit): IBS-C: Recommend MiraLAX and Colace as needed.  Reassured.  Continue FODMAP diet.  Limit enema use Generalized anxiety disorder with attention deficit: Increase Prozac to 40 mg daily.  Monitor response.  Continue Adderall daily.  Prescriptions for 3 months refill. Dyshidrotic eczema: Likely also neurotic dermatitis contributing.  She should betamethasone cream.  Limit use.  Follow-up with dermatology if can get  controlled. Normocytic anemia: Further evaluation,/work-up today.  We will need to help control her menstrual cycle.  Likely cannot tolerate iron given chronic constipation.  Check iron levels, assess need for iron infusion.  Check B12 and folate levels as well.  Follow up: 3 months to recheck mood and ADD  Orders Placed This  Encounter  Procedures   CBC with Differential/Platelet   B12 and Folate Panel   Iron, TIBC and Ferritin Panel   Hepatitis C antibody   Lipid panel   Meds ordered this encounter  Medications   betamethasone dipropionate 0.05 % cream    Sig: Apply sparingly to affected areas twice a day for 2 weeks. Do not use on face    Dispense:  30 g    Refill:  0   amphetamine-dextroamphetamine (ADDERALL XR) 10 MG 24 hr capsule    Sig: Take 1 capsule (10 mg total) by mouth daily.    Dispense:  30 capsule    Refill:  0   amphetamine-dextroamphetamine (ADDERALL XR) 10 MG 24 hr capsule    Sig: Take 1 capsule (10 mg total) by mouth daily.    Dispense:  30 capsule    Refill:  0   amphetamine-dextroamphetamine (ADDERALL XR) 10 MG 24 hr capsule    Sig: Take 1 capsule (10 mg total) by mouth daily.    Dispense:  30 capsule    Refill:  0   PARoxetine (PAXIL) 40 MG tablet    Sig: Take 1 tablet (40 mg total) by mouth daily.    Dispense:  90 tablet    Refill:  3    Increasing the dose, may stop the '20mg'$  refill       Body mass index is 19.12 kg/m. Wt Readings from Last 3 Encounters:  08/19/20 111 lb 6.4 oz (50.5 kg)  07/07/20 116 lb 9.6 oz (52.9 kg)  04/26/20 117 lb 9.6 oz (53.3 kg)     Patient Active Problem List   Diagnosis Date Noted   GAD (generalized anxiety disorder) 04/26/2020    Priority: High   Attention deficit 04/26/2020    Priority: High    ?ADD vs GAD/PMDD and adjustment reaction; started adderall 10 02/2020 and had a very positive response.     History of gestational diabetes 08/29/2017    Priority: Medium   Allergic asthma 08/29/2017    Priority: Medium   PMDD (premenstrual dysphoric disorder) 08/29/2017    Priority: Medium   Dyshidrotic eczema 04/26/2020    Priority: Low   Seasonal allergic rhinitis 08/29/2017    Priority: Low   Irritable bowel syndrome with constipation 08/19/2020   Health Maintenance  Topic Date Due   Hepatitis C Screening  Never done    COVID-19 Vaccine (3 - Booster for Pfizer series) 09/04/2020 (Originally 08/23/2019)   INFLUENZA VACCINE  08/22/2020   PAP SMEAR-Modifier  10/11/2022   TETANUS/TDAP  07/11/2024   HIV Screening  Completed   Pneumococcal Vaccine 26-55 Years old  Aged Out   HPV VACCINES  Aged Out   Immunization History  Administered Date(s) Administered   Influenza, Seasonal, Injecte, Preservative Fre 10/25/2014   Influenza,inj,Quad PF,6+ Mos 10/10/2017   PFIZER(Purple Top)SARS-COV-2 Vaccination 03/02/2019, 03/23/2019   Pneumococcal Polysaccharide-23 08/31/2012   Tdap 06/27/2011, 08/30/2012, 07/12/2014   We updated and reviewed the patient's past history in detail and it is documented below. Allergies: Patient  reports current alcohol use. Past Medical History Patient  has a past medical history of Allergic asthma (08/29/2017), Allergy, Asthma, Chicken pox, Depression,  GERD (gastroesophageal reflux disease), History of frequent urinary tract infections, and History of gestational diabetes (08/29/2017). Past Surgical History Patient  has a past surgical history that includes Neck mass excision (1989). 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  Tobacco Use   Smoking status: Never   Smokeless tobacco: Never  Vaping Use   Vaping Use: Never used  Substance and Sexual Activity   Alcohol use: Yes    Comment: rarely   Drug use: No   Sexual activity: Yes    Birth control/protection: Other-see comments    Comment: Husband had a vasectomy  Other Topics Concern   Not on file  Social History Narrative   Not on file   Social Determinants of Health   Financial Resource Strain: Not on file  Food Insecurity: Not on file  Transportation Needs: Not on file  Physical Activity: Not on file  Stress: Not  on file  Social Connections: Not on file   Family History  Problem Relation Age of Onset   Leukemia Maternal Grandmother    Cancer Maternal Grandmother    Heart disease Maternal Grandfather    Diabetes Maternal Grandfather    Down syndrome Daughter    Speech disorder Daughter    Restless legs syndrome Mother    Restless legs syndrome Father    Stroke Paternal Grandfather     Review of Systems: Constitutional: negative for fever or malaise Ophthalmic: negative for photophobia, double vision or loss of vision Cardiovascular: negative for chest pain, dyspnea on exertion, or new LE swelling Respiratory: negative for SOB or persistent cough Gastrointestinal: negative for abdominal pain, change in bowel habits or melena Genitourinary: negative for dysuria or gross hematuria, no abnormal uterine bleeding or disharge Musculoskeletal: negative for new gait disturbance or muscular weakness Integumentary: negative for new or persistent rashes, no breast lumps Neurological: negative for TIA or stroke symptoms Psychiatric: negative for SI or delusions Allergic/Immunologic: negative for hives  Patient Care Team    Relationship Specialty Notifications Start End  Leamon Arnt, MD PCP - General Family Medicine  08/29/17     Objective  Vitals: BP 135/90   Pulse 70   Temp 98 F (36.7 C) (Temporal)   Wt 111 lb 6.4 oz (50.5 kg)   SpO2 100%   BMI 19.12 kg/m  General:  Well developed, well nourished, no acute distress  Psych:  Alert and orientedx3,normal mood and affect, call her today HEENT:  Normocephalic, atraumatic, non-icteric sclera, PERRL, supple neck without adenopathy, mass or thyromegaly Cardiovascular:  Normal S1, S2, RRR without gallop, rub or murmur Respiratory:  Good breath sounds bilaterally, CTAB with normal respiratory effort Gastrointestinal: normal bowel sounds, soft, non-tender, no noted masses. No HSM MSK: no deformities, contusions. Joints are without erythema or  swelling.  Skin:  Warm, multiple scars on hands from picking, several excoriated areas on the tops of her feet.  Few erythematous lesions with small papules on fingers. Neurologic:    Mental status is normal. Gross motor and sensory exams are normal. Normal gait. No tremor    Commons side effects, risks, benefits, and alternatives for medications and treatment plan prescribed today were discussed, and the patient expressed understanding of the given instructions. Patient is instructed to call or  message via MyChart if he/she has any questions or concerns regarding our treatment plan. No barriers to understanding were identified. We discussed Red Flag symptoms and signs in detail. Patient expressed understanding regarding what to do in case of urgent or emergency type symptoms.  Medication list was reconciled, printed and provided to the patient in AVS. Patient instructions and summary information was reviewed with the patient as documented in the AVS. This note was prepared with assistance of Dragon voice recognition software. Occasional wrong-word or sound-a-like substitutions may have occurred due to the inherent limitations of voice recognition software  This visit occurred during the SARS-CoV-2 public health emergency.  Safety protocols were in place, including screening questions prior to the visit, additional usage of staff PPE, and extensive cleaning of exam room while observing appropriate contact time as indicated for disinfecting solutions.

## 2020-08-19 NOTE — Patient Instructions (Addendum)
Please return in 3 months to recheck ADD and mood.   I will release your lab results to you on your MyChart account with further instructions. Please reply with any questions.   Discuss your heavy menstrual cycles with the GYN; you are now anemic.   We are increasing the prozac dose today.  I've sent in a stronger steroid cream. If the eczema persists, follow up with the dermatologist for further options.   I've refilled your Adderall for the next 3 months; the pharmacy has the prescriptions on file.   Continue with miralax and colace. Increase miralax if needed.   If you have any questions or concerns, please don't hesitate to send me a message via MyChart or call the office at [phone]. Thank you for visiting with Anne Humphrey today! It's our pleasure caring for you.

## 2020-08-22 LAB — IRON,TIBC AND FERRITIN PANEL
%SAT: 7 % (calc) — ABNORMAL LOW (ref 16–45)
Ferritin: 6 ng/mL — ABNORMAL LOW (ref 16–154)
Iron: 32 ug/dL — ABNORMAL LOW (ref 40–190)
TIBC: 431 mcg/dL (calc) (ref 250–450)

## 2020-08-22 LAB — HEPATITIS C ANTIBODY
Hepatitis C Ab: NONREACTIVE
SIGNAL TO CUT-OFF: 0.01 (ref <1.00)

## 2020-08-23 ENCOUNTER — Other Ambulatory Visit: Payer: Self-pay

## 2020-08-24 ENCOUNTER — Other Ambulatory Visit: Payer: Self-pay

## 2020-08-24 ENCOUNTER — Ambulatory Visit: Payer: BC Managed Care – PPO | Admitting: Psychology

## 2020-08-24 DIAGNOSIS — E611 Iron deficiency: Secondary | ICD-10-CM

## 2020-08-25 ENCOUNTER — Other Ambulatory Visit: Payer: Self-pay | Admitting: *Deleted

## 2020-08-25 DIAGNOSIS — D509 Iron deficiency anemia, unspecified: Secondary | ICD-10-CM

## 2020-08-26 ENCOUNTER — Telehealth: Payer: Self-pay | Admitting: Nurse Practitioner

## 2020-08-26 NOTE — Telephone Encounter (Signed)
Received a new hem referral from Dr. Jonni Sanger for iron deficiency. Ms. Anne Humphrey has been cld and scheduled to see Lattie Haw on 8/25 at 11:15am w/labs at 8/23 at 8am. Pt aware toa rrive 15 minutes early for both appts.

## 2020-09-13 ENCOUNTER — Other Ambulatory Visit: Payer: Self-pay

## 2020-09-13 ENCOUNTER — Inpatient Hospital Stay: Payer: BC Managed Care – PPO | Attending: Oncology

## 2020-09-13 DIAGNOSIS — L989 Disorder of the skin and subcutaneous tissue, unspecified: Secondary | ICD-10-CM | POA: Insufficient documentation

## 2020-09-13 DIAGNOSIS — K219 Gastro-esophageal reflux disease without esophagitis: Secondary | ICD-10-CM | POA: Insufficient documentation

## 2020-09-13 DIAGNOSIS — D509 Iron deficiency anemia, unspecified: Secondary | ICD-10-CM | POA: Diagnosis present

## 2020-09-13 DIAGNOSIS — R140 Abdominal distension (gaseous): Secondary | ICD-10-CM | POA: Diagnosis not present

## 2020-09-13 DIAGNOSIS — J45909 Unspecified asthma, uncomplicated: Secondary | ICD-10-CM | POA: Diagnosis not present

## 2020-09-13 DIAGNOSIS — K5900 Constipation, unspecified: Secondary | ICD-10-CM | POA: Insufficient documentation

## 2020-09-13 DIAGNOSIS — Z87440 Personal history of urinary (tract) infections: Secondary | ICD-10-CM | POA: Insufficient documentation

## 2020-09-13 DIAGNOSIS — Z79899 Other long term (current) drug therapy: Secondary | ICD-10-CM | POA: Insufficient documentation

## 2020-09-13 DIAGNOSIS — G2581 Restless legs syndrome: Secondary | ICD-10-CM | POA: Diagnosis not present

## 2020-09-13 LAB — CBC WITH DIFFERENTIAL (CANCER CENTER ONLY)
Abs Immature Granulocytes: 0.02 10*3/uL (ref 0.00–0.07)
Basophils Absolute: 0 10*3/uL (ref 0.0–0.1)
Basophils Relative: 1 %
Eosinophils Absolute: 0.2 10*3/uL (ref 0.0–0.5)
Eosinophils Relative: 3 %
HCT: 33.6 % — ABNORMAL LOW (ref 36.0–46.0)
Hemoglobin: 10.8 g/dL — ABNORMAL LOW (ref 12.0–15.0)
Immature Granulocytes: 0 %
Lymphocytes Relative: 28 %
Lymphs Abs: 1.6 10*3/uL (ref 0.7–4.0)
MCH: 27.1 pg (ref 26.0–34.0)
MCHC: 32.1 g/dL (ref 30.0–36.0)
MCV: 84.2 fL (ref 80.0–100.0)
Monocytes Absolute: 0.4 10*3/uL (ref 0.1–1.0)
Monocytes Relative: 8 %
Neutro Abs: 3.4 10*3/uL (ref 1.7–7.7)
Neutrophils Relative %: 60 %
Platelet Count: 286 10*3/uL (ref 150–400)
RBC: 3.99 MIL/uL (ref 3.87–5.11)
RDW: 15.5 % (ref 11.5–15.5)
WBC Count: 5.6 10*3/uL (ref 4.0–10.5)
nRBC: 0 % (ref 0.0–0.2)

## 2020-09-13 LAB — SAVE SMEAR(SSMR), FOR PROVIDER SLIDE REVIEW

## 2020-09-14 ENCOUNTER — Telehealth: Payer: Self-pay | Admitting: *Deleted

## 2020-09-14 NOTE — Telephone Encounter (Signed)
Called patient to confirm new patient appointment for 09/15/20 at 11:15 w/arrival 15 minutes early. She is already aware of office location and parking. Informed of visitor and mask policy and that appointment will last 1 hour. Suggested a sweater due to office being cool.

## 2020-09-14 NOTE — Progress Notes (Signed)
New Hematology/Oncology Consult   Requesting MD: Dr. Billey Chang  [phone]  Reason for Consult: Iron deficiency anemia  HPI: Anne Humphrey has been referred for evaluation of iron deficiency anemia.  CBC from 08/19/2020 shows hemoglobin 11.8, MCV 82, iron 32, TIBC 431, percent saturation 7, ferritin 6, B12 low normal range at 225, folate 5.7 (normal range greater than 5.9).  This compares to a hemoglobin of 10.5, MCV 82 on 07/07/2020.  Review of the EMR shows hemoglobin 8.2, MCV 76 on 09/12/2014 and hemoglobin 8.3, MCV 78.8 on 08/31/2012.  The August 2016 and August 2014 labs appear to be when she was hospitalized for labor/delivery.    She reports a long history of iron deficiency anemia.  She has had constipation with oral iron in the past, thinks she was taking Slow Fe.  She does not eat a lot of meat.  She does eat fruits and vegetables.  Only bleeding is related to her menstrual cycle which she characterizes as heavy for 3 days and then lighter for the remainder of the cycle.  She wonders if she is perimenopausal due to a change in her cycle last month which has included spotting.  She reports having constipation her "whole life".  No bloody or black stools.  No maroon stools.  She often feels bloated.  The constipation has worsened over the past many months.  She takes MiraLAX and a stool softener.  No nausea or vomiting.  She is not aware of hematuria.  She recently noticed that her toenails are "curling".  She has a crack at the corner of the lip on the right side.  No mouth sores.  She does not crave ice.  She has skin lesions that she reports are "slow to heal".  She was told these are areas of eczema.  She reports she "picks" at the skin lesions.  She reports restless legs and more recently arms.  She has periodic numbness/tingling in her hands which she relates to carpal tunnel syndrome.  She was not aware of Dr. Tamela Oddi recommendation to begin [id number] and folic acid and thus has not started  these yet.  Past Medical History:  Diagnosis Date   Allergic asthma 08/29/2017   Allergy    Asthma    Chicken pox    Depression    GERD (gastroesophageal reflux disease)    History of frequent urinary tract infections    History of gestational diabetes 08/29/2017  :   Past Surgical History:  Procedure Laterality Date   NECK MASS EXCISION  1989  :   Current Outpatient Medications:    albuterol (PROVENTIL HFA;VENTOLIN HFA) 108 (90 BASE) MCG/ACT inhaler, Inhale 2 puffs into the lungs every 6 (six) hours as needed for wheezing., Disp: , Rfl:    amphetamine-dextroamphetamine (ADDERALL XR) 10 MG 24 hr capsule, Take 1 capsule (10 mg total) by mouth daily., Disp: 30 capsule, Rfl: 0   betamethasone dipropionate 0.05 % cream, Apply sparingly to affected areas twice a day for 2 weeks. Do not use on face, Disp: 30 g, Rfl: 0   Docusate Sodium (COLACE PO), Take by mouth., Disp: , Rfl:    PARoxetine (PAXIL) 40 MG tablet, Take 1 tablet (40 mg total) by mouth daily., Disp: 90 tablet, Rfl: 3   polyethylene glycol (MIRALAX / GLYCOLAX) 17 g packet, Take 17 g by mouth daily., Disp: , Rfl:    spironolactone (ALDACTONE) 50 MG tablet, Take 50 mg by mouth daily., Disp: , Rfl: :   No Known  Allergies  FH: Maternal grandmother died at age 68 with leukemia.    SOCIAL HISTORY: She lives in [place].  She is married.  She has 4 children ages 26, 38, 54 and 9.  40-year-old has Down syndrome.  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  87-year-old has apraxia.  She reports tobacco and EtOH use as social.  She has never had a blood transfusion.  Review of Systems: In addition to what is noted in the HPI she denies fever.  She has an occasional night sweat.  No unusual headaches.  No vision change.  No shortness of breath or cough.  No leg swelling or calf pain.  Physical Exam:  Pulse 62, temperature 98.3 F (36.8 C), temperature source Oral, weight 115 lb 9.6 oz (52.4 kg), SpO2 100 %, unknown if currently  breastfeeding.  HEENT: No thrush or ulcers.  Area of erythema angle of the lips on the right side.   Lungs: Lungs clear bilaterally. Cardiac: Regular rate and rhythm. Abdomen: Abdomen soft and nontender.  No hepatosplenomegaly. Vascular: No leg edema. Lymph nodes: Tiny bilateral inguinal lymph nodes. Neurologic: Alert and oriented. Skin: Scattered areas of scarring on hands, lower arms, feet and lower legs.  Several erythematous/scabbed lesions with raised border in the same distribution.   LABS:   Recent Labs    09/13/20 0806  WBC 5.6  HGB 10.8*  HCT 33.6*  PLT 286  Peripheral blood smear: #1 RBCs-microcytes, ovalocytes, acanthocytes, burr cells, few teardrops, few cigar cells, no increase in polychromasia, marked variation in red cell size and shape; #2 WBCs-morphology unremarkable; #3 platelets-normal in number  RADIOLOGY:  No results found.  Assessment and Plan:   Iron deficiency anemia most likely due to menstrual blood loss B12 low normal range 08/19/2020; serum folate mildly low 08/19/2020 Constipation, bloating Skin lesions  [name] has iron deficiency anemia most likely due to menstrual blood loss.  She will submit a urine specimen for urinalysis and complete a set of stool cards to rule out other potential sources of blood loss.  She agrees to a trial of ferrous sulfate 325 mg twice daily.  She will continue laxatives and stool softeners, understands to contact the office if she is unable to tolerate oral iron.  She will return to the lab today for a repeat B12 level and RBC folate.  We made a referral today to gastroenterology to evaluate GI symptoms.  She will follow-up with dermatology regarding the skin lesions.  She will return for lab and follow-up in approximately 6 weeks.  She understands to contact the office in the interim as outlined above or with any other problems.  Patient seen with Dr. Benay Spice.    Ned Card, NP 09/15/2020, 11:58 AM    This was a shared visit with Ned Card.  [name] was interviewed and examined.  I reviewed the peripheral blood smear. She is referred for evaluation of anemia.  She has iron deficiency anemia, most likely related to menstrual blood loss.  We have a low clinical suspicion for a primary hematologic diagnosis.  We will repeat a vitamin B12 level.  The skin lesions and GI symptoms are most likely unrelated to the anemia.  She will return a stool sample for Hemoccult testing.  We made a referral to gastroenterology.  I was present for greater than 50% of today's visit.  I performed medical decision making.  Julieanne Manson, MD

## 2020-09-15 ENCOUNTER — Inpatient Hospital Stay: Payer: BC Managed Care – PPO

## 2020-09-15 ENCOUNTER — Other Ambulatory Visit: Payer: BC Managed Care – PPO

## 2020-09-15 ENCOUNTER — Inpatient Hospital Stay (HOSPITAL_BASED_OUTPATIENT_CLINIC_OR_DEPARTMENT_OTHER): Payer: BC Managed Care – PPO | Admitting: Nurse Practitioner

## 2020-09-15 ENCOUNTER — Other Ambulatory Visit: Payer: Self-pay

## 2020-09-15 VITALS — HR 62 | Temp 98.3°F | Wt 115.6 lb

## 2020-09-15 DIAGNOSIS — D509 Iron deficiency anemia, unspecified: Secondary | ICD-10-CM

## 2020-09-15 LAB — URINALYSIS, COMPLETE (UACMP) WITH MICROSCOPIC
Bilirubin Urine: NEGATIVE
Glucose, UA: NEGATIVE mg/dL
Hgb urine dipstick: NEGATIVE
Ketones, ur: NEGATIVE mg/dL
Leukocytes,Ua: NEGATIVE
Nitrite: NEGATIVE
Protein, ur: NEGATIVE mg/dL
Specific Gravity, Urine: 1.014 (ref 1.005–1.030)
pH: 7.5 (ref 5.0–8.0)

## 2020-09-15 LAB — RETIC PANEL
Immature Retic Fract: 13.6 % (ref 2.3–15.9)
RBC.: 4.3 MIL/uL (ref 3.87–5.11)
Retic Count, Absolute: 47.3 10*3/uL (ref 19.0–186.0)
Retic Ct Pct: 1.1 % (ref 0.4–3.1)
Reticulocyte Hemoglobin: 27.4 pg — ABNORMAL LOW (ref >27.9)

## 2020-09-15 LAB — VITAMIN B12: Vitamin B-12: 223 pg/mL (ref 180–914)

## 2020-09-16 LAB — FOLATE RBC
Folate, Hemolysate: 228 ng/mL
Folate, RBC: 648 ng/mL (ref >498)
Hematocrit: 35.2 % (ref 34.0–46.6)

## 2020-09-20 ENCOUNTER — Other Ambulatory Visit (HOSPITAL_BASED_OUTPATIENT_CLINIC_OR_DEPARTMENT_OTHER): Payer: Self-pay

## 2020-09-20 DIAGNOSIS — D509 Iron deficiency anemia, unspecified: Secondary | ICD-10-CM | POA: Diagnosis not present

## 2020-09-20 LAB — OCCULT BLOOD X 1 CARD TO LAB, STOOL
Fecal Occult Bld: NEGATIVE
Fecal Occult Bld: NEGATIVE
Fecal Occult Bld: NEGATIVE

## 2020-09-27 ENCOUNTER — Ambulatory Visit: Payer: BC Managed Care – PPO | Admitting: Internal Medicine

## 2020-09-28 ENCOUNTER — Telehealth: Payer: Self-pay

## 2020-09-28 NOTE — Telephone Encounter (Signed)
-----   Message from Owens Shark, NP sent at 09/28/2020  7:39 AM EDT ----- Please have her begin B12 1000ug daily

## 2020-09-28 NOTE — Telephone Encounter (Signed)
Called spoke to pt made aware of provider instruction to begin B12 pt states she started medication

## 2020-10-17 ENCOUNTER — Ambulatory Visit (INDEPENDENT_AMBULATORY_CARE_PROVIDER_SITE_OTHER): Payer: BC Managed Care – PPO | Admitting: Internal Medicine

## 2020-10-17 ENCOUNTER — Encounter: Payer: Self-pay | Admitting: Internal Medicine

## 2020-10-17 VITALS — BP 100/70 | HR 76 | Ht 63.5 in | Wt 112.1 lb

## 2020-10-17 DIAGNOSIS — R140 Abdominal distension (gaseous): Secondary | ICD-10-CM | POA: Diagnosis not present

## 2020-10-17 DIAGNOSIS — D509 Iron deficiency anemia, unspecified: Secondary | ICD-10-CM

## 2020-10-17 DIAGNOSIS — K581 Irritable bowel syndrome with constipation: Secondary | ICD-10-CM | POA: Diagnosis not present

## 2020-10-17 DIAGNOSIS — K5900 Constipation, unspecified: Secondary | ICD-10-CM | POA: Diagnosis not present

## 2020-10-17 MED ORDER — NA SULFATE-K SULFATE-MG SULF 17.5-3.13-1.6 GM/177ML PO SOLN: 1.0000 | Freq: Once | ORAL | 0 refills | Status: AC

## 2020-10-17 MED ORDER — LINACLOTIDE 145 MCG PO CAPS: 145.0000 ug | ORAL_CAPSULE | Freq: Every day | ORAL | 3 refills | Status: AC

## 2020-10-17 NOTE — Patient Instructions (Addendum)
We have sent the following medications to your pharmacy for you to pick up at your convenience: Linzess 145 mcg daily.  Start Benefiber or Citrucel 2 teaspoons in 8 ounces of liquid daily.  Referral placed for Pelvic Floor Physical Therapy.  You have been scheduled for an endoscopy and colonoscopy. Please follow the written instructions given to you at your visit today. Please pick up your prep supplies at the pharmacy within the next 1-3 days. If you use inhalers (even only as needed), please bring them with you on the day of your procedure.  If you are age 24 or older, your body mass index should be between 23-30. Your Body mass index is 19.55 kg/m. If this is out of the aforementioned range listed, please consider follow up with your Primary Care Provider.  If you are age 58 or younger, your body mass index should be between 19-25. Your Body mass index is 19.55 kg/m. If this is out of the aformentioned range listed, please consider follow up with your Primary Care Provider.   __________________________________________________________  The [facility] providers would like to encourage you to use [facility] to communicate with providers for non-urgent requests or questions.  Due to long hold times on the telephone, sending your provider a message by [facility] may be a faster and more efficient way to get a response.  Please allow 48 business hours for a response.  Please remember that this is for non-urgent requests.

## 2020-10-17 NOTE — Progress Notes (Signed)
Chief Complaint: IDA  HPI : 40 year old female with history of IBS-C, asthma and GERD presents for IDA  She suspects that she has been anemic for the last 30 years ever since her period started when she was a teenager. She has heavy menstrual periods, endorses filling up her menstrual cup every 2 hours. She has been working with her OB/GYN on her heavy menstrual periods. She has had issues with constipation that have been longstanding for years, though in 06/2020 she started noticing some additional issues with bloating. Her bloating has not been as much of an issue since she has started doing the low FODMAP diet. She has noted that garlic and onions were a really bad trigger for her bloating. She is taking two doses of Miralax and stool softener daily. She tried enemas in the past but these do not always adequately clear her out. Currently she states that her constipation and bloating are slightly better controlled. She is having on average two BMs per week. She drinks plenty of water but does not take in much fiber. She typically snacks during the daytime and eats a full meal for dinner. Denies hematochezia, melena. Only has ab discomfort when bloated. Nausea comes with certain medications. She has some trouble initiating swallows, though this only occurs very occasionally. She has hemorrhoids but they do not typically bleed. Weight has been stable. Denies fam hx of GI cancers. For her IDA, she is currently on PO iron supplements. Has been noted to have low vitamin B12 and folate in the past. She has had 4 children in the past.  Wt Readings from Last 3 Encounters:  10/17/20 112 lb 2 oz (50.9 kg)  09/15/20 115 lb 9.6 oz (52.4 kg)  08/19/20 111 lb 6.4 oz (50.5 kg)   Past Medical History:  Diagnosis Date   Allergic asthma 08/29/2017   Allergy    Anemia    Anxiety    Asthma    Chicken pox    Depression    GERD (gastroesophageal reflux disease)    History of frequent urinary tract infections     History of gestational diabetes 08/29/2017   IBS (irritable bowel syndrome)     Past Surgical History:  Procedure Laterality Date   NECK MASS EXCISION  1989   Family History  Problem Relation Age of Onset   Restless legs syndrome Mother    Colon polyps Mother    Other Mother        pre-diabetes   Hyperlipidemia Mother    Hypertension Mother    Restless legs syndrome Father    Leukemia Maternal Grandmother    Heart disease Maternal Grandfather    Diabetes Maternal Grandfather    Stroke Paternal Grandfather    Down syndrome Daughter    Speech disorder Daughter    Stroke Maternal Uncle    Stroke Maternal Uncle    Social History   Tobacco Use   Smoking status: Never   Smokeless tobacco: Never  Vaping Use   Vaping Use: Never used  Substance Use Topics   Alcohol use: Not Currently    Comment: rarely   Drug use: No   Current Outpatient Medications  Medication Sig Dispense Refill   albuterol (PROVENTIL HFA;VENTOLIN HFA) 108 (90 BASE) MCG/ACT inhaler Inhale 2 puffs into the lungs every 6 (six) hours as needed for wheezing.     amphetamine-dextroamphetamine (ADDERALL XR) 10 MG 24 hr capsule Take 1 capsule (10 mg total) by mouth daily. 30 capsule 0  Ascorbic Acid (VITAMIN C) 250 MG CHEW Chew 1 tablet by mouth daily.     betamethasone dipropionate 0.05 % cream Apply sparingly to affected areas twice a day for 2 weeks. Do not use on face 30 g 0   Cyanocobalamin (B-12) 1000 MCG SUBL Take 1 tablet by mouth daily.     Docusate Sodium (COLACE PO) Take by mouth.     ferrous sulfate 325 (65 FE) MG tablet Take 1 tablet by mouth daily.     folic acid (FOLVITE) 355 MCG tablet Take 1 tablet by mouth daily.     medroxyPROGESTERone (PROVERA) 10 MG tablet Take 10 mg by mouth daily.     PARoxetine (PAXIL) 40 MG tablet Take 1 tablet (40 mg total) by mouth daily. 90 tablet 3   polyethylene glycol (MIRALAX / GLYCOLAX) 17 g packet Take 17 g by mouth daily.     spironolactone (ALDACTONE) 50 MG  tablet Take 50 mg by mouth daily.     No current facility-administered medications for this visit.   No Known Allergies   Review of Systems: All systems reviewed and negative except where noted in HPI.   Physical Exam: BP 100/70 (BP Location: Left Arm, Patient Position: Sitting, Cuff Size: Normal)   Pulse 76   Ht 5' 3.5" (1.613 m) Comment: height measured without shoes  Wt 112 lb 2 oz (50.9 kg)   LMP 10/03/2020   BMI 19.55 kg/m  Constitutional: Pleasant,well-developed, female in no acute distress. HEENT: Normocephalic and atraumatic. Conjunctivae are normal. No scleral icterus. Cardiovascular: Normal rate, regular rhythm.  Pulmonary/chest: Effort normal and breath sounds normal. No wheezing, rales or rhonchi. Abdominal: Soft, nondistended, nontender. Bowel sounds active throughout. There are no masses palpable. No hepatomegaly. Extremities: No edema Neurological: Alert and oriented to person place and time. Skin: Skin is warm and dry. Erythematous papules noted on her extremities Psychiatric: Normal mood and affect. Behavior is normal.  FOBT 09/20/20: negative x 3  Labs 08/2020: Hb 10.8. Nml Vit B12, nml folate, retic index 1.1%  Labs 07/2020: Hb 11.8. Ferritin low at 6, iron sat low at 6%, serum iron low at 32. Low folate.  ASSESSMENT AND PLAN: Iron deficiency anemia Constipation Abdominal bloating Patient has had longstanding issues with anemia and constipation. She has heavy menstrual periods that could explain her IDA, though it is also reasonable to look for a GI etiology in the setting of her low folate and borderline low vitamin B12. Her anemia could be explained by a nutritional deficiency (patient endorses a poor diet), malabsorptive disorder (though seems less likely without diarrhea), or autoimmune atrophic gastritis. Will plan to further evaluate her IDA with EGD and colonoscopy. For her constipation, she is already Miralax BID and docusate QD with an occasional enema,  which is causing her to have 2 BMs weekly. Thus she may benefit from additional pharmacologic therapy. Will plan to start her on daily Linzess to see how well she tolerates this. Since she has had 4 children in the past, would also consider contribution from pelvic floor dysfunction that could benefit from pelvic floor PT - Continue following the guidelines of a low FODMAP diet - Start fiber supplement - Start Linzess 145 mcg daily - Start pelvic floor PT - EGD/colonoscopy [facility]. Will plan to obtain gastric and duodenal biopsies at that time. - RTC in 2 months  [name], MD

## 2020-10-27 ENCOUNTER — Inpatient Hospital Stay (HOSPITAL_BASED_OUTPATIENT_CLINIC_OR_DEPARTMENT_OTHER): Payer: BC Managed Care – PPO | Admitting: Oncology

## 2020-10-27 ENCOUNTER — Telehealth: Payer: Self-pay | Admitting: Oncology

## 2020-10-27 ENCOUNTER — Inpatient Hospital Stay: Payer: BC Managed Care – PPO | Attending: Oncology

## 2020-10-27 ENCOUNTER — Other Ambulatory Visit: Payer: Self-pay

## 2020-10-27 VITALS — BP 113/71 | HR 70 | Temp 97.8°F | Resp 18 | Ht 63.0 in | Wt 115.2 lb

## 2020-10-27 DIAGNOSIS — D509 Iron deficiency anemia, unspecified: Secondary | ICD-10-CM | POA: Diagnosis not present

## 2020-10-27 DIAGNOSIS — R140 Abdominal distension (gaseous): Secondary | ICD-10-CM | POA: Diagnosis not present

## 2020-10-27 DIAGNOSIS — L989 Disorder of the skin and subcutaneous tissue, unspecified: Secondary | ICD-10-CM | POA: Insufficient documentation

## 2020-10-27 DIAGNOSIS — K5900 Constipation, unspecified: Secondary | ICD-10-CM | POA: Insufficient documentation

## 2020-10-27 DIAGNOSIS — N920 Excessive and frequent menstruation with regular cycle: Secondary | ICD-10-CM | POA: Diagnosis not present

## 2020-10-27 DIAGNOSIS — Z79899 Other long term (current) drug therapy: Secondary | ICD-10-CM | POA: Diagnosis not present

## 2020-10-27 LAB — CBC WITH DIFFERENTIAL (CANCER CENTER ONLY)
Abs Immature Granulocytes: 0.01 10*3/uL (ref 0.00–0.07)
Basophils Absolute: 0 10*3/uL (ref 0.0–0.1)
Basophils Relative: 0 %
Eosinophils Absolute: 0.1 10*3/uL (ref 0.0–0.5)
Eosinophils Relative: 2 %
HCT: 37.2 % (ref 36.0–46.0)
Hemoglobin: 12 g/dL (ref 12.0–15.0)
Immature Granulocytes: 0 %
Lymphocytes Relative: 30 %
Lymphs Abs: 1.6 10*3/uL (ref 0.7–4.0)
MCH: 27.6 pg (ref 26.0–34.0)
MCHC: 32.3 g/dL (ref 30.0–36.0)
MCV: 85.7 fL (ref 80.0–100.0)
Monocytes Absolute: 0.4 10*3/uL (ref 0.1–1.0)
Monocytes Relative: 8 %
Neutro Abs: 3.3 10*3/uL (ref 1.7–7.7)
Neutrophils Relative %: 60 %
Platelet Count: 216 10*3/uL (ref 150–400)
RBC: 4.34 MIL/uL (ref 3.87–5.11)
RDW: 18.7 % — ABNORMAL HIGH (ref 11.5–15.5)
WBC Count: 5.5 10*3/uL (ref 4.0–10.5)
nRBC: 0 % (ref 0.0–0.2)

## 2020-10-27 LAB — FERRITIN: Ferritin: 71 ng/mL (ref 11–307)

## 2020-10-27 NOTE — Progress Notes (Signed)
  [facility] OFFICE PROGRESS NOTE   Diagnosis: Iron deficiency anemia  INTERVAL HISTORY:   Ms. Debruyne returns as scheduled.  She is taking iron once daily.  No nausea.  She reports constipation since beginning iron.  She is taking a stool softener and MiraLAX.  She was recently started on Linzess by Dr. Lorenso Courier.  She is being scheduled for an upper endoscopy and colonoscopy.  The RBC folate and vitamin B12 levels returned normal on 09/15/2020.  A urinalysis was negative for blood and stool Hemoccult cards returned negative in August.  She denies bleeding other than her menstrual cycle.  She continues to have intermittent development of skin lesions.  She is scheduled to see dermatology.  Objective:  Vital signs in last 24 hours:  Blood pressure 113/71, pulse 70, temperature 97.8 F (36.6 C), temperature source Oral, resp. rate 18, height 5\' 3"  (1.6 m), weight 115 lb 3.2 oz (52.3 kg), last menstrual period 10/03/2020, SpO2 100 %, unknown if currently breastfeeding.    Resp: Lungs clear bilaterally Cardio: Regular rate and rhythm GI: No hepatosplenomegaly Vascular: No leg edema  Skin: Multiple scars and abrasions over the extremities   Lab Results:  Lab Results  Component Value Date   WBC 5.5 10/27/2020   HGB 12.0 10/27/2020   HCT 37.2 10/27/2020   MCV 85.7 10/27/2020   PLT 216 10/27/2020   NEUTROABS 3.3 10/27/2020    CMP  Lab Results  Component Value Date   NA 138 07/07/2020   K 4.0 07/07/2020   CL 105 07/07/2020   CO2 27 07/07/2020   GLUCOSE 94 07/07/2020   BUN 8 07/07/2020   CREATININE 0.62 07/07/2020   CALCIUM 8.5 07/07/2020   PROT 6.1 07/07/2020   ALBUMIN 3.8 07/07/2020   AST 37 07/07/2020   ALT 38 (H) 07/07/2020   ALKPHOS 60 07/07/2020   BILITOT 0.4 07/07/2020     Medications: I have reviewed the patient's current medications.   Assessment/Plan: Iron deficiency anemia most likely due to menstrual blood loss B12 low normal range  08/19/2020; serum folate mildly low 08/19/2020 Low normal vitamin B12 and normal RBC folate 09/15/2020 Constipation, bloating Skin lesions    Disposition: Ms. Schild has a history of iron deficiency anemia.  The hemoglobin has corrected into the normal range with ferrous sulfate.  The iron deficiency is likely related to menstrual blood loss.  She will continue ferrous sulfate and a stool softener.  She was started on Linzess by gastroenterology.  She is scheduled for an endoscopic evaluation within the next few weeks.  She reports a dermatology appointment is scheduled to evaluate the skin lesions.  She will be discharged from the hematology clinic today.  She will continue follow-up with Dr. Jonni Sanger.  I recommend a repeat CBC and vitamin B12 level in 6-12 months.  I am available to see her again if she develops a progressive hematologic abnormality.  Betsy Coder, MD  10/27/2020  9:02 AM

## 2020-10-27 NOTE — Telephone Encounter (Signed)
Per 10/6 los  F/u TBA

## 2020-11-10 ENCOUNTER — Encounter: Payer: Self-pay | Admitting: Internal Medicine

## 2020-11-10 ENCOUNTER — Ambulatory Visit (AMBULATORY_SURGERY_CENTER): Payer: BC Managed Care – PPO | Admitting: Internal Medicine

## 2020-11-10 VITALS — BP 116/79 | HR 60 | Temp 98.9°F | Resp 14 | Ht 63.5 in | Wt 112.0 lb

## 2020-11-10 DIAGNOSIS — K635 Polyp of colon: Secondary | ICD-10-CM

## 2020-11-10 DIAGNOSIS — K2990 Gastroduodenitis, unspecified, without bleeding: Secondary | ICD-10-CM

## 2020-11-10 DIAGNOSIS — K2970 Gastritis, unspecified, without bleeding: Secondary | ICD-10-CM | POA: Diagnosis not present

## 2020-11-10 DIAGNOSIS — K6289 Other specified diseases of anus and rectum: Secondary | ICD-10-CM | POA: Diagnosis not present

## 2020-11-10 DIAGNOSIS — D509 Iron deficiency anemia, unspecified: Secondary | ICD-10-CM | POA: Diagnosis present

## 2020-11-10 DIAGNOSIS — K2950 Unspecified chronic gastritis without bleeding: Secondary | ICD-10-CM

## 2020-11-10 DIAGNOSIS — K317 Polyp of stomach and duodenum: Secondary | ICD-10-CM | POA: Diagnosis not present

## 2020-11-10 DIAGNOSIS — K648 Other hemorrhoids: Secondary | ICD-10-CM | POA: Diagnosis not present

## 2020-11-10 DIAGNOSIS — K449 Diaphragmatic hernia without obstruction or gangrene: Secondary | ICD-10-CM | POA: Diagnosis not present

## 2020-11-10 DIAGNOSIS — D124 Benign neoplasm of descending colon: Secondary | ICD-10-CM

## 2020-11-10 MED ORDER — SODIUM CHLORIDE 0.9 % IV SOLN: 500.0000 mL | Freq: Once | INTRAVENOUS | Status: DC

## 2020-11-10 NOTE — Progress Notes (Signed)
Pt Drowsy. VSS. To PACU, report to RN. No anesthetic complications noted.  

## 2020-11-10 NOTE — Op Note (Addendum)
[name] Procedure Date: 11/10/2020 1:25 PM MRN: [id number] Endoscopist: Sonny Masters "Anne Humphrey ,  Age: 40 Referring MD:  Date of Birth: 1980/06/22 Gender: Female Account #: [id number] Procedure:                Upper GI endoscopy Indications:              Iron deficiency anemia Medicines:                Monitored Anesthesia Care Procedure:                Pre-Anesthesia Assessment:                           - Prior to the procedure, a History and Physical                            was performed, and patient medications and                            allergies were reviewed. The patient's tolerance of                            previous anesthesia was also reviewed. The risks                            and benefits of the procedure and the sedation                            options and risks were discussed with the patient.                            All questions were answered, and informed consent                            was obtained. Prior Anticoagulants: The patient has                            taken no previous anticoagulant or antiplatelet                            agents. ASA Grade Assessment: II - A patient with                            mild systemic disease. After reviewing the risks                            and benefits, the patient was deemed in                            satisfactory condition to undergo the procedure.                           After obtaining informed consent, the endoscope was  passed under direct vision. Throughout the                            procedure, the patient's blood pressure, pulse, and                            oxygen saturations were monitored continuously. The                            GIF [id number] was introduced through the                            mouth, and advanced to the second part of duodenum.                            The upper GI endoscopy was  accomplished without                            difficulty. The patient tolerated the procedure                            well. Scope In: Scope Out: Findings:                 The examined esophagus was normal.                           A medium-sized hiatal hernia was present.                           One 5 mm sessile polyp with no bleeding and no                            stigmata of recent bleeding was found in the                            gastric body. The polyp was removed with a cold                            snare. Resection and retrieval were complete.                           Localized inflammation characterized by congestion                            (edema) and erythema was found in the gastric                            antrum. Biopsies were taken with a cold forceps for                            Helicobacter pylori testing.                           The examined duodenum was normal.  Biopsies for                            histology were taken with a cold forceps for                            evaluation of celiac disease. Complications:            No immediate complications. Estimated Blood Loss:     Estimated blood loss was minimal. Impression:               - Normal esophagus.                           - Medium-sized hiatal hernia.                           - One gastric polyp. Resected and retrieved.                           - Gastritis. Biopsied.                           - Normal examined duodenum. Biopsied. Recommendation:           - Await pathology results.                           - Perform a colonoscopy. [address]Christia Humphrey,  11/10/2020 2:35:09 PM

## 2020-11-10 NOTE — Op Note (Signed)
[facility] Patient Name: Leonie Amacher Procedure Date: 11/10/2020 1:24 PM MRN: [id number] Endoscopist: Sonny Masters "[name] ,  Age: 40 Referring MD:  Date of Birth: Jul 28, 1980 Gender: Female Account #: [id number] Procedure:                Colonoscopy Indications:              Iron deficiency anemia Medicines:                Monitored Anesthesia Care Procedure:                Pre-Anesthesia Assessment:                           - Prior to the procedure, a History and Physical                            was performed, and patient medications and                            allergies were reviewed. The patient's tolerance of                            previous anesthesia was also reviewed. The risks                            and benefits of the procedure and the sedation                            options and risks were discussed with the patient.                            All questions were answered, and informed consent                            was obtained. Prior Anticoagulants: The patient has                            taken no previous anticoagulant or antiplatelet                            agents. ASA Grade Assessment: II - A patient with                            mild systemic disease. After reviewing the risks                            and benefits, the patient was deemed in                            satisfactory condition to undergo the procedure.                           After obtaining informed consent, the colonoscope  was passed under direct vision. Throughout the                            procedure, the patient's blood pressure, pulse, and                            oxygen saturations were monitored continuously. The                            Olympus [id number] ([id number]) Colonoscope was                            introduced through the anus and advanced to the the                            terminal ileum. The colonoscopy was  performed                            without difficulty. The patient tolerated the                            procedure well. The quality of the bowel                            preparation was adequate. Scope In: 2:02:11 PM Scope Out: 2:25:59 PM Scope Withdrawal Time: 0 hours 16 minutes 41 seconds  Total Procedure Duration: 0 hours 23 minutes 48 seconds  Findings:                 The terminal ileum appeared normal.                           A polyp was found in the descending colon. The                            polyp was sessile. The polyp was removed with a                            cold snare. Resection was complete, but the polyp                            tissue was not retrieved.                           Non-bleeding internal hemorrhoids were found during                            retroflexion.                           There was mild prolapse of rectal tissue on                            perianal exam. Complications:            No immediate complications. Estimated Blood  Loss:     Estimated blood loss was minimal. Impression:               - The examined portion of the ileum was normal.                           - One polyp in the descending colon, removed with a                            cold snare. Complete resection. Polyp tissue not                            retrieved.                           - Non-bleeding internal hemorrhoids.                           - Rectal prolapse. Recommendation:           - Discharge patient to home (with escort).                           - No source of iron deficiency anemia was found. I                            suspect that your anemia is due to your heavy                            menstrual periods.                           - Await pathology results.                           - Regular diet                           - Recommend that you start routine colon cancer                            screening at age 45.                            - Return to GI clinic as previously scheduled.                           - The findings and recommendations were discussed                            with the patient. Sonny Masters "[name],  11/10/2020 2:51:19 PM

## 2020-11-10 NOTE — Progress Notes (Signed)
Called to room to assist during endoscopic procedure.  Patient ID and intended procedure confirmed with present staff. Received instructions for my participation in the procedure from the performing physician.  

## 2020-11-10 NOTE — Progress Notes (Signed)
GASTROENTEROLOGY PROCEDURE H&P NOTE   Primary Care Physician: Leamon Arnt, MD    Reason for Procedure:   IDA  Plan:    EGD/colonoscopy  Patient is appropriate for endoscopic procedure(s) in the ambulatory ([facility]) setting.  The nature of the procedure, as well as the risks, benefits, and alternatives were carefully and thoroughly reviewed with the patient. Ample time for discussion and questions allowed. The patient understood, was satisfied, and agreed to proceed.     HPI: Anne Humphrey is a 40 y.o. female who presents for EGD/colonoscopy for evaluation of IDA .  Patient was most recently seen in the Gastroenterology Clinic on 10/17/20.  No interval change in medical history since that appointment. Please refer to that note for full details regarding GI history and clinical presentation.   Past Medical History:  Diagnosis Date   Allergic asthma 08/29/2017   Allergy    Anemia    Anxiety    Asthma    Chicken pox    Depression    GERD (gastroesophageal reflux disease)    History of frequent urinary tract infections    History of gestational diabetes 08/29/2017   IBS (irritable bowel syndrome)     Past Surgical History:  Procedure Laterality Date   NECK MASS EXCISION  1989    Prior to Admission medications   Medication Sig Start Date End Date Taking? Authorizing Provider  amphetamine-dextroamphetamine (ADDERALL XR) 10 MG 24 hr capsule Take 1 capsule (10 mg total) by mouth daily. 09/02/20  Yes Leamon Arnt, MD  folic acid (FOLVITE) 237 MCG tablet Take 1 tablet by mouth daily. 09/22/20  Yes [provider]  linaclotide Rolan Lipa) 145 MCG CAPS capsule Take 1 capsule (145 mcg total) by mouth daily before breakfast. 10/17/20  Yes Sharyn Creamer, MD  PARoxetine (PAXIL) 40 MG tablet Take 1 tablet (40 mg total) by mouth daily. 08/19/20  Yes Leamon Arnt, MD  albuterol (PROVENTIL HFA;VENTOLIN HFA) 108 (90 BASE) MCG/ACT inhaler Inhale 2 puffs into the lungs every 6  (six) hours as needed for wheezing.    [provider]  Ascorbic Acid (VITAMIN C) 250 MG CHEW Chew 1 tablet by mouth daily. 09/22/20   [provider]  betamethasone dipropionate 0.05 % cream Apply sparingly to affected areas twice a day for 2 weeks. Do not use on face 08/19/20   Leamon Arnt, MD  Cyanocobalamin (B-12) 1000 MCG SUBL Take 1 tablet by mouth daily. 09/22/20   [provider]  Docusate Sodium (COLACE PO) Take by mouth.    [provider]  ferrous sulfate 325 (65 FE) MG tablet Take 1 tablet by mouth daily. 09/22/20   [provider]  polyethylene glycol (MIRALAX / GLYCOLAX) 17 g packet Take 17 g by mouth daily.    [provider]    Current Outpatient Medications  Medication Sig Dispense Refill   amphetamine-dextroamphetamine (ADDERALL XR) 10 MG 24 hr capsule Take 1 capsule (10 mg total) by mouth daily. 30 capsule 0   folic acid (FOLVITE) 628 MCG tablet Take 1 tablet by mouth daily.     linaclotide (LINZESS) 145 MCG CAPS capsule Take 1 capsule (145 mcg total) by mouth daily before breakfast. 30 capsule 3   PARoxetine (PAXIL) 40 MG tablet Take 1 tablet (40 mg total) by mouth daily. 90 tablet 3   albuterol (PROVENTIL HFA;VENTOLIN HFA) 108 (90 BASE) MCG/ACT inhaler Inhale 2 puffs into the lungs every 6 (six) hours as needed for wheezing.  Ascorbic Acid (VITAMIN C) 250 MG CHEW Chew 1 tablet by mouth daily.     betamethasone dipropionate 0.05 % cream Apply sparingly to affected areas twice a day for 2 weeks. Do not use on face 30 g 0   Cyanocobalamin (B-12) 1000 MCG SUBL Take 1 tablet by mouth daily.     Docusate Sodium (COLACE PO) Take by mouth.     ferrous sulfate 325 (65 FE) MG tablet Take 1 tablet by mouth daily.     polyethylene glycol (MIRALAX / GLYCOLAX) 17 g packet Take 17 g by mouth daily.     Current Facility-Administered Medications  Medication Dose Route Frequency Provider Last Rate Last Admin   0.9 %  sodium chloride  infusion  500 mL Intravenous Once Sharyn Creamer, MD        Allergies as of 11/10/2020   (No Known Allergies)    Family History  Problem Relation Age of Onset   Restless legs syndrome Mother    Colon polyps Mother    Other Mother        pre-diabetes   Hyperlipidemia Mother    Hypertension Mother    Restless legs syndrome Father    Leukemia Maternal Grandmother    Heart disease Maternal Grandfather    Diabetes Maternal Grandfather    Stroke Paternal Grandfather    Down syndrome Daughter    Speech disorder Daughter    Stroke Maternal Uncle    Stroke Maternal Uncle     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  Tobacco Use   Smoking status: Never   Smokeless tobacco: Never  Vaping Use   Vaping Use: Never used  Substance and Sexual Activity   Alcohol use: Not Currently    Comment: rarely   Drug use: No   Sexual activity: Yes    Birth control/protection: Other-see comments    Comment: Husband had a vasectomy  Other Topics Concern   Not on file  Social History Narrative   Not on file   Social Determinants of Health   Financial Resource Strain: Not on file  Food Insecurity: Not on file  Transportation Needs: Not on file  Physical Activity: Not on file  Stress: Not on file  Social Connections: Not on file  Intimate Partner Violence: Not on file    Physical Exam: Vital signs in last 24 hours: BP 121/79   Pulse 60   Temp 98.9 F (37.2 C)   Resp (!) 8   Ht 5' 3.5" (1.613 m)   Wt 112 lb (50.8 kg)   LMP 10/27/2020   SpO2 100%   BMI 19.53 kg/m  GEN: NAD EYE: Sclerae anicteric ENT: MMM CV: Non-tachycardic Pulm: No increased WOB GI: Soft NEURO:  Alert & Oriented   [name], MD [facility]   11/10/2020 1:43 PM

## 2020-11-10 NOTE — Patient Instructions (Signed)
YOU HAD AN ENDOSCOPIC PROCEDURE TODAY AT [facility]:   Refer to the procedure report that was given to you for any specific questions about what was found during the examination.  If the procedure report does not answer your questions, please call your gastroenterologist to clarify.  If you requested that your care partner not be given the details of your procedure findings, then the procedure report has been included in a sealed envelope for you to review at your convenience later.  YOU SHOULD EXPECT: Some feelings of bloating in the abdomen. Passage of more gas than usual.  Walking can help get rid of the air that was put into your GI tract during the procedure and reduce the bloating. If you had a lower endoscopy (such as a colonoscopy or flexible sigmoidoscopy) you may notice spotting of blood in your stool or on the toilet paper. If you underwent a bowel prep for your procedure, you may not have a normal bowel movement for a few days.  Please Note:  You might notice some irritation and congestion in your nose or some drainage.  This is from the oxygen used during your procedure.  There is no need for concern and it should clear up in a day or so.  SYMPTOMS TO REPORT IMMEDIATELY:  Following lower endoscopy (colonoscopy or flexible sigmoidoscopy):  Excessive amounts of blood in the stool  Significant tenderness or worsening of abdominal pains  Swelling of the abdomen that is new, acute  Fever of 100F or higher  Following upper endoscopy (EGD)  Vomiting of blood or coffee ground material  New chest pain or pain under the shoulder blades  Painful or persistently difficult swallowing  New shortness of breath  Fever of 100F or higher  Black, tarry-looking stools  For urgent or emergent issues, a gastroenterologist can be reached at any hour by calling [phone]. Do not use MyChart messaging for urgent concerns.    DIET:  We do recommend a small meal at first, but  then you may proceed to your regular diet.  Drink plenty of fluids but you should avoid alcoholic beverages for 24 hours.  MEDICATIONS: Continue present medications.  Return to GI clinic as previously scheduled.  Please see handouts given to you by your recovery nurse.  Thank you for allowing [place] to provide for your healthcare needs today.  ACTIVITY:  You should plan to take it easy for the rest of today and you should NOT DRIVE or use heavy machinery until tomorrow (because of the sedation medicines used during the test).    FOLLOW UP: Our staff will call the number listed on your records 48-72 hours following your procedure to check on you and address any questions or concerns that you may have regarding the information given to you following your procedure. If we do not reach you, we will leave a message.  We will attempt to reach you two times.  During this call, we will ask if you have developed any symptoms of COVID 19. If you develop any symptoms (ie: fever, flu-like symptoms, shortness of breath, cough etc.) before then, please call [phone].  If you test positive for Covid 19 in the 2 weeks post procedure, please call and report this information to [place].    If any biopsies were taken you will be contacted by phone or by letter within the next 1-3 weeks.  Please call us at [phone] if you have not heard about the biopsies in 3  weeks.    SIGNATURES/CONFIDENTIALITY: You and/or your care partner have signed paperwork which will be entered into your electronic medical record.  These signatures attest to the fact that that the information above on your After Visit Summary has been reviewed and is understood.  Full responsibility of the confidentiality of this discharge information lies with you and/or your care-partner.

## 2020-11-14 ENCOUNTER — Telehealth: Payer: Self-pay

## 2020-11-14 NOTE — Telephone Encounter (Signed)
  Follow up Call-  Call back number 11/10/2020  Post procedure Call Back phone  # [phone]  Permission to leave phone message Yes  Some recent data might be hidden     Patient questions:  Do you have a fever, pain , or abdominal swelling? No. Pain Score  0 *  Have you tolerated food without any problems? Yes.    Have you been able to return to your normal activities? Yes.    Do you have any questions about your discharge instructions: Diet   No. Medications  No. Follow up visit  No.  Do you have questions or concerns about your Care? No.  Actions: * If pain score is 4 or above: No action needed, pain <4.  Have you developed a fever since your procedure? no  2.   Have you had an respiratory symptoms (SOB or cough) since your procedure? no  3.   Have you tested positive for COVID 19 since your procedure no  4.   Have you had any family members/close contacts diagnosed with the COVID 19 since your procedure?  no   If yes to any of these questions please route to Joylene John, RN and Joella Prince, RN

## 2020-11-17 ENCOUNTER — Encounter: Payer: Self-pay | Admitting: Internal Medicine

## 2020-11-21 ENCOUNTER — Ambulatory Visit (INDEPENDENT_AMBULATORY_CARE_PROVIDER_SITE_OTHER): Payer: BC Managed Care – PPO | Admitting: Family Medicine

## 2020-11-21 ENCOUNTER — Other Ambulatory Visit: Payer: Self-pay

## 2020-11-21 ENCOUNTER — Encounter: Payer: Self-pay | Admitting: Family Medicine

## 2020-11-21 VITALS — BP 118/72 | HR 71 | Temp 98.7°F | Ht 64.0 in | Wt 113.4 lb

## 2020-11-21 DIAGNOSIS — E538 Deficiency of other specified B group vitamins: Secondary | ICD-10-CM

## 2020-11-21 DIAGNOSIS — R41840 Attention and concentration deficit: Secondary | ICD-10-CM | POA: Diagnosis not present

## 2020-11-21 DIAGNOSIS — E611 Iron deficiency: Secondary | ICD-10-CM | POA: Diagnosis not present

## 2020-11-21 DIAGNOSIS — G5603 Carpal tunnel syndrome, bilateral upper limbs: Secondary | ICD-10-CM

## 2020-11-21 DIAGNOSIS — F411 Generalized anxiety disorder: Secondary | ICD-10-CM

## 2020-11-21 DIAGNOSIS — K581 Irritable bowel syndrome with constipation: Secondary | ICD-10-CM

## 2020-11-21 MED ORDER — AMPHETAMINE-DEXTROAMPHET ER 10 MG PO CP24: 10.0000 mg | ORAL_CAPSULE | Freq: Every day | ORAL | 0 refills | Status: DC

## 2020-11-21 NOTE — Progress Notes (Signed)
Subjective  CC:  Chief Complaint  Patient presents with   ADD   Carpal Tunnel    Ongoing for a few weeks, flare up. Bilateral, right is worse    HPI: Anne Humphrey is a 40 y.o. female who presents to the office today to address the problems listed above in the chief complaint.  Patient is here today for follow up of ADD/ADHD. She is taking medication as directed and continues to feel it is beneficial. The medications continue to help with focus and attention and task completion. She denies adverse side effects; specifically no headaches, appetite suppression, weight loss, sleeping difficulty, heart palpitations, chest pain or significant weight changes. Anxiety disorder: 3 months ago we increased Paxil from 20 to 40 mg daily.  She feels it continues to help her calm down anxiety, manage stressors.  She is sleeping better.  Her mood is much better controlled.  She was hoping it would be more helpful to decrease her picking behaviors but it has not.  Fortunately during the colder months, long sleeves helps more.  No adverse effects. Reviewed hematology notes.  Reviewed GI notes.  Currently doing better with constipation and IBS.  Taking Linzess.  Has had iron infusion.  Iron deficiency anemia is resolved.  Taking B12 supplements.  Energy levels are much improved with supplements. Complains of bilateral hand pain right worse than left.  She built a Pharmacologist coop that took her 5 weeks.  Since she is having some numbness and tingling in the right hand as well.  No weakness.  Takes ibuprofen on occasion.  Has history of carpal tunnel   Assessment  1. GAD (generalized anxiety disorder)   2. Attention deficit   3. Iron deficiency   4. Vitamin B12 deficiency   5. Bilateral carpal tunnel syndrome      Plan  ADD: Well-controlled on Adderall.  Prescriptions refilled. General anxiety disorder: Doing very well on Paxil 40 mg daily.  Continue Continue iron and B12 supplements.  Will monitor  levels.  Clinically improved Carpal tunnel syndrome and overuse tendinitis/arthritis: Education given.  Ice, rest, ibuprofen twice daily for 7 to 10 days, respond on right and follow-up if not improving. Patient declines a flu shot  Follow up: 6 months to recheck mood and ADD No orders of the defined types were placed in this encounter.  Meds ordered this encounter  Medications   amphetamine-dextroamphetamine (ADDERALL XR) 10 MG 24 hr capsule    Sig: Take 1 capsule (10 mg total) by mouth daily.    Dispense:  30 capsule    Refill:  0   amphetamine-dextroamphetamine (ADDERALL XR) 10 MG 24 hr capsule    Sig: Take 1 capsule (10 mg total) by mouth daily.    Dispense:  30 capsule    Refill:  0   amphetamine-dextroamphetamine (ADDERALL XR) 10 MG 24 hr capsule    Sig: Take 1 capsule (10 mg total) by mouth daily.    Dispense:  30 capsule    Refill:  0      I reviewed the patients updated PMH, FH, and SocHx.    Patient Active Problem List   Diagnosis Date Noted   GAD (generalized anxiety disorder) 04/26/2020    Priority: 1.   Attention deficit 04/26/2020    Priority: 1.   History of gestational diabetes 08/29/2017    Priority: 2.   Allergic asthma 08/29/2017    Priority: 2.   PMDD (premenstrual dysphoric disorder) 08/29/2017    Priority:  2.   Dyshidrotic eczema 04/26/2020    Priority: 3.   Seasonal allergic rhinitis 08/29/2017    Priority: 3.   Iron deficiency 11/21/2020   Vitamin B12 deficiency 11/21/2020   Irritable bowel syndrome with constipation 08/19/2020   Current Meds  Medication Sig   albuterol (PROVENTIL HFA;VENTOLIN HFA) 108 (90 BASE) MCG/ACT inhaler Inhale 2 puffs into the lungs every 6 (six) hours as needed for wheezing.   Ascorbic Acid (VITAMIN C) 250 MG CHEW Chew 1 tablet by mouth daily.   betamethasone dipropionate 0.05 % cream Apply sparingly to affected areas twice a day for 2 weeks. Do not use on face   Cyanocobalamin (B-12) 1000 MCG SUBL Take 1 tablet by  mouth daily.   ferrous sulfate 325 (65 FE) MG tablet Take 1 tablet by mouth daily.   folic acid (FOLVITE) 540 MCG tablet Take 1 tablet by mouth daily.   linaclotide (LINZESS) 145 MCG CAPS capsule Take 1 capsule (145 mcg total) by mouth daily before breakfast.   PARoxetine (PAXIL) 40 MG tablet Take 1 tablet (40 mg total) by mouth daily.   [DISCONTINUED] amphetamine-dextroamphetamine (ADDERALL XR) 10 MG 24 hr capsule Take 1 capsule (10 mg total) by mouth daily.    Allergies: Patient has No Known Allergies. Family History: Patient family history includes Colon polyps in her mother; Diabetes in her maternal grandfather; Down syndrome in her daughter; Heart disease in her maternal grandfather; Hyperlipidemia in her mother; Hypertension in her mother; Leukemia in her maternal grandmother; Other in her mother; Restless legs syndrome in her father and mother; Speech disorder in her daughter; Stroke in her maternal uncle, maternal uncle, and paternal grandfather. Social History:  Patient  reports that she has never smoked. She has never used smokeless tobacco. She reports that she does not currently use alcohol. She reports that she does not use drugs.  Review of Systems: Constitutional: Negative for fever malaise or anorexia Cardiovascular: negative for chest pain Respiratory: negative for SOB or persistent cough Gastrointestinal: negative for abdominal pain  Objective  Vitals: BP 118/72   Pulse 71   Temp 98.7 F (37.1 C) (Temporal)   Ht 5\' 4"  (1.626 m)   Wt 113 lb 6.4 oz (51.4 kg)   LMP 10/27/2020 (Exact Date)   SpO2 93%   Breastfeeding Unknown   BMI 19.47 kg/m  General: no acute distress , A&Ox3 Psych: Happy appearing today, smiling.  Calm mood and demeanor  Musculoskeletal: No synovitis present but tender joints in bilateral hands.  Pain with full flexion and grasping.   Commons side effects, risks, benefits, and alternatives for medications and treatment plan prescribed today were  discussed, and the patient expressed understanding of the given instructions. Patient is instructed to call or message via MyChart if he/she has any questions or concerns regarding our treatment plan. No barriers to understanding were identified. We discussed Red Flag symptoms and signs in detail. Patient expressed understanding regarding what to do in case of urgent or emergency type symptoms.  Medication list was reconciled, printed and provided to the patient in AVS. Patient instructions and summary information was reviewed with the patient as documented in the AVS. This note was prepared with assistance of Dragon voice recognition software. Occasional wrong-word or sound-a-like substitutions may have occurred due to the inherent limitations of voice recognition software

## 2020-11-21 NOTE — Patient Instructions (Addendum)
Please return in 6 months for mood and ADD recheck.   I have filled your Adderall for the next 3 months. When due for refills after that, please send a refill request or mychart request.   If you have any questions or concerns, please don't hesitate to send me a message via MyChart or call the office at [phone]. Thank you for visiting with Anne Humphrey today! It's our pleasure caring for you.

## 2020-12-20 ENCOUNTER — Ambulatory Visit: Payer: BC Managed Care – PPO | Admitting: Internal Medicine

## 2020-12-26 ENCOUNTER — Ambulatory Visit (INDEPENDENT_AMBULATORY_CARE_PROVIDER_SITE_OTHER): Payer: BC Managed Care – PPO | Admitting: Internal Medicine

## 2020-12-26 ENCOUNTER — Encounter: Payer: Self-pay | Admitting: Internal Medicine

## 2020-12-26 DIAGNOSIS — D509 Iron deficiency anemia, unspecified: Secondary | ICD-10-CM

## 2020-12-26 DIAGNOSIS — K5900 Constipation, unspecified: Secondary | ICD-10-CM | POA: Diagnosis not present

## 2020-12-26 DIAGNOSIS — R140 Abdominal distension (gaseous): Secondary | ICD-10-CM | POA: Diagnosis not present

## 2020-12-26 NOTE — Progress Notes (Addendum)
Chief Complaint: IDA, constipation  HPI : 40 year old female with history of IBS-C, asthma and GERD presents for IDA and constipation  Interval History: Patient has been doing well since her last visit with me. Has been having 3-4 BMs per week, which is much improved from the 2 BMs per week that she was having previously. The stools are now nice and soft. She does not feeel like it is hard to get out the BMs. Because her stools are soft, she has to wipe more to keep herself clean. She has not been doing enemas, Miralax, or stool softeners. The Linzess has made all the difference, and she also felt more cleaned out after she had the colonoscopy procedure. She has been following the low FODMAP diet. Has not been taking fiber because she has already had good effect with Linzess. Bloating has improved. Denies melena, hematochezia, weight loss.   Wt Readings from Last 3 Encounters:  12/26/20 121 lb (54.9 kg)  11/21/20 113 lb 6.4 oz (51.4 kg)  11/10/20 112 lb (50.8 kg)   Current Outpatient Medications  Medication Sig Dispense Refill   albuterol (PROVENTIL HFA;VENTOLIN HFA) 108 (90 BASE) MCG/ACT inhaler Inhale 2 puffs into the lungs every 6 (six) hours as needed for wheezing.     [START ON 01/20/2021] amphetamine-dextroamphetamine (ADDERALL XR) 10 MG 24 hr capsule Take 1 capsule (10 mg total) by mouth daily. 30 capsule 0   Ascorbic Acid (VITAMIN C) 250 MG CHEW Chew 1 tablet by mouth daily.     betamethasone dipropionate 0.05 % cream Apply sparingly to affected areas twice a day for 2 weeks. Do not use on face 30 g 0   Cyanocobalamin (B-12) 1000 MCG SUBL Take 1 tablet by mouth daily.     ferrous sulfate 325 (65 FE) MG tablet Take 1 tablet by mouth daily.     folic acid (FOLVITE) 419 MCG tablet Take 1 tablet by mouth daily.     linaclotide (LINZESS) 145 MCG CAPS capsule Take 1 capsule (145 mcg total) by mouth daily before breakfast. 30 capsule 3   PARoxetine (PAXIL) 40 MG tablet Take 1 tablet (40 mg  total) by mouth daily. 90 tablet 3   No current facility-administered medications for this visit.   Review of Systems: All systems reviewed and negative except where noted in HPI.   Physical Exam: BP 118/74   Pulse 62   Ht 5\' 4"  (1.626 m)   Wt 121 lb (54.9 kg)   BMI 20.77 kg/m  Constitutional: Pleasant,well-developed, female in no acute distress. HEENT: Normocephalic and atraumatic. Conjunctivae are normal. No scleral icterus. Pulmonary/chest: No increased WOB Neurological: Alert and oriented to person place and time. Skin: Skin is warm and dry. Erythematous papules noted on her extremities Psychiatric: Normal mood and affect. Behavior is normal.  Labs 07/2020: Hb 11.8. Ferritin low at 6, iron sat low at 6%, serum iron low at 32. Low folate.  Labs 08/2020: Hb 10.8. Nml Vit B12, nml folate, retic index 1.1%  FOBT 09/20/20: negative x 3  Labs 10/2020: CBC with Hb of 12  EGD 11/10/20: - Normal esophagus. - Medium-sized hiatal hernia. - One gastric polyp. Resected and retrieved. - Gastritis. Biopsied. - Normal examined duodenum. Biopsied. 1. Surgical [P], duodenal bx PRESERVED VILLOUS ARCHITECTURE. NO EVIDENCE OF SPRUE, DYSPLASIA OR MALIGNANCY. 2. Surgical [P], gastric bx MILD CHRONIC GASTRITIS. NO HELICOBACTER PYLORI ORGANISMS IDENTIFIED. NO INTESTINAL METAPLASIA, DYSPLASIA OR MALIGNANCY IDENTIFIED. 3. Surgical [P], gastric polyp, polyp (1) CONSISTENT WITH FUNDIC GLAND  POLYP. MILD CHRONIC GASTRITIS. NO HELICOBACTER PYLORI ORGANISMS IDENTIFIED. NO INTESTINAL METAPLASIA, DYSPLASIA OR MALIGNANCY IDENTIFIED.  Colonoscopy 11/10/20: - The examined portion of the ileum was normal. - One polyp in the descending colon, removed with a cold snare. Complete resection. Polyp tissue not retrieved. - Non-bleeding internal hemorrhoids. - Rectal prolapse. Path: 4. Surgical [P], colon, descending, polyp (1) HYPERPLASTIC CHANGE. NO HIGH-GRADE DYSPLASIA OR MALIGNANCY  IDENTIFIED.  ASSESSMENT AND PLAN: Iron deficiency anemia - resolved Constipation - improved Abdominal bloating - resolved Patient has had significant improvement in her anemia and constipation since our last clinic visit on 10/17/20. Her anemia has resolved with her last Hb checked as 12. Her constipation has improved significantly on Linzess therapy. She is having to wipe more due to her softer stools, but this is suspected to be also partially related to the rectal prolapse that was noted on her recent colonoscopy procedure. Patient is still interested in pelvic floor PT so will refer her to get evaluated to see if she can improve her bowel habits further. - Continue following the guidelines of a low FODMAP diet - Cont Linzess 145 mcg daily - Start pelvic floor PT - Repeat colonoscopy for routine colon cancer screening starting at age 74 - RTC in 75 months  [name], MD  I spent 30 minutes of time, including in depth chart review, independent review of results as outlined above, communicating results with the patient directly, face-to-face time with the patient, coordinating care, and ordering studies and medications as appropriate, and documentation.

## 2020-12-26 NOTE — Addendum Note (Signed)
Addended by: Stevan Born on: 12/26/2020 02:10 PM   Modules accepted: Orders

## 2020-12-26 NOTE — Patient Instructions (Signed)
You will be contacted by [facility] for  pelvic floor therapy.   Please follow up with Dr. Lorenso Courier in 6 months.

## 2021-05-15 ENCOUNTER — Telehealth: Payer: Self-pay

## 2021-05-15 NOTE — Telephone Encounter (Signed)
-----   Message from Sheridan sent at 12/26/2020  4:44 PM EST ----- ?Regarding: 6 month ?Needs flup with YD dx constipation June 2023 ? ?

## 2021-05-15 NOTE — Telephone Encounter (Signed)
Left message for patient to return call to schedule follow up appointment with Dr Lorenso Courier.  Will continue efforts.  ?

## 2021-05-17 NOTE — Telephone Encounter (Signed)
Left message for patient to return call to schedule follow up appointment with Dr Lorenso Courier.  Will continue efforts.  ?

## 2021-05-19 NOTE — Telephone Encounter (Signed)
Left message for patient to return call to schedule follow up appointment with Dr Lorenso Courier.  Will send patient a reminder letter.  ?

## 2021-05-22 ENCOUNTER — Ambulatory Visit (INDEPENDENT_AMBULATORY_CARE_PROVIDER_SITE_OTHER): Payer: 59 | Admitting: Family Medicine

## 2021-05-22 ENCOUNTER — Encounter: Payer: Self-pay | Admitting: Family Medicine

## 2021-05-22 VITALS — BP 120/70 | HR 76 | Temp 98.2°F | Ht 64.0 in | Wt 123.2 lb

## 2021-05-22 DIAGNOSIS — F411 Generalized anxiety disorder: Secondary | ICD-10-CM

## 2021-05-22 DIAGNOSIS — R69 Illness, unspecified: Secondary | ICD-10-CM | POA: Diagnosis not present

## 2021-05-22 DIAGNOSIS — R41840 Attention and concentration deficit: Secondary | ICD-10-CM | POA: Diagnosis not present

## 2021-05-22 MED ORDER — AMPHETAMINE-DEXTROAMPHET ER 10 MG PO CP24: 10.0000 mg | ORAL_CAPSULE | Freq: Every day | ORAL | 0 refills | Status: DC

## 2021-05-22 MED ORDER — AMPHETAMINE-DEXTROAMPHET ER 10 MG PO CP24: 10.0000 mg | ORAL_CAPSULE | Freq: Every day | ORAL | 0 refills | Status: AC

## 2021-05-22 NOTE — Patient Instructions (Signed)
Please return in 3 months for your annual complete physical; please come fasting. And recheck ADD and mood ? ?I have given you your printed prescriptions for your ADD medications. Please keep them in a safe place so you will have them monthly to take to your pharmacy for refills as they come due. I will not be able to reprint these prescriptions so please treat them like money and keep them safe.  ? ? ?If you have any questions or concerns, please don't hesitate to send me a message via MyChart or call the office at [phone]. Thank you for visiting with [place] today! It's our pleasure caring for you.  ?

## 2021-05-22 NOTE — Progress Notes (Signed)
? ?Subjective  ?CC:  ?Chief Complaint  ?Patient presents with  ?  Mood Disorder  ?  Pt stated that it has been 14mo since she has been taken her Adderall. She stated that she was forgetting and they the pharmacy was having trouble with it been in stock but she feels that she needs it bc she has been all over the place.  ? ADHD  ? ? ?HPI: Anne Bolligis a 41y.o. female who presents to the office today to address the problems listed above in the chief complaint. ? ?Patient is here today for follow up of ADD/ADHD.  As noted above, she has been out of it for several months due to difficulty with pharmacy starting it.  She would like to restart.  It has been working well. ? ?Follow-up anxiety: Forgot to take Paxil and eventually stopped it.  Has been off for about 2 months.  However, anxiety mood been doing very well.  Home life has stabilized.  No longer having trouble with picking or panic attacks.  Not sure that she needs it.  She does carry a chronic history of anxiety disorder. ? ? ?Assessment  ?1. Attention deficit   ?2. GAD (generalized anxiety disorder)   ? ?  ?Plan  ?ADD: We will restart Adderall XR 10 mg daily.  Printed prescriptions for the next 3 months. ?Anxiety disorder: Counseling done.  Patient will monitor for recurrence of symptoms.  If needed, I would have a low threshold of restarting Paxil.  Patient agrees.  Currently she is stable ? ?Follow up: Return in about 3 months (around 08/22/2021) for complete physical, follow up on ADD.  ?No orders of the defined types were placed in this encounter. ? ?Meds ordered this encounter  ?Medications  ? DISCONTD: amphetamine-dextroamphetamine (ADDERALL XR) 10 MG 24 hr capsule  ?  Sig: Take 1 capsule (10 mg total) by mouth daily.  ?  Dispense:  30 capsule  ?  Refill:  0  ? DISCONTD: amphetamine-dextroamphetamine (ADDERALL XR) 10 MG 24 hr capsule  ?  Sig: Take 1 capsule (10 mg total) by mouth daily.  ?  Dispense:  30 capsule  ?  Refill:  0  ?  amphetamine-dextroamphetamine (ADDERALL XR) 10 MG 24 hr capsule  ?  Sig: Take 1 capsule (10 mg total) by mouth daily.  ?  Dispense:  30 capsule  ?  Refill:  0  ? ?  ? ?I reviewed the patients updated PMH, FH, and SocHx.  ?  ?Patient Active Problem List  ? Diagnosis Date Noted  ? GAD (generalized anxiety disorder) 04/26/2020  ?  Priority: High  ? Attention deficit 04/26/2020  ?  Priority: High  ? History of gestational diabetes 08/29/2017  ?  Priority: Medium   ? Allergic asthma 08/29/2017  ?  Priority: Medium   ? PMDD (premenstrual dysphoric disorder) 08/29/2017  ?  Priority: Medium   ? Dyshidrotic eczema 04/26/2020  ?  Priority: Low  ? Seasonal allergic rhinitis 08/29/2017  ?  Priority: Low  ? Iron deficiency 11/21/2020  ? Vitamin B12 deficiency 11/21/2020  ? Irritable bowel syndrome with constipation 08/19/2020  ? ?Current Meds  ?Medication Sig  ? albuterol (PROVENTIL HFA;VENTOLIN HFA) 108 (90 BASE) MCG/ACT inhaler Inhale 2 puffs into the lungs every 6 (six) hours as needed for wheezing.  ? Ascorbic Acid (VITAMIN C) 250 MG CHEW Chew 1 tablet by mouth daily.  ? betamethasone dipropionate 0.05 % cream Apply sparingly to affected areas twice a  day for 2 weeks. Do not use on face  ? Cyanocobalamin (B-12) 1000 MCG SUBL Take 1 tablet by mouth daily.  ? ferrous sulfate 325 (65 FE) MG tablet Take 1 tablet by mouth daily.  ? folic acid (FOLVITE) 694 MCG tablet Take 1 tablet by mouth daily.  ? [DISCONTINUED] amphetamine-dextroamphetamine (ADDERALL XR) 10 MG 24 hr capsule Take 1 capsule (10 mg total) by mouth daily.  ? ? ?Allergies: ?Patient has No Known Allergies. ?Family History: ?Patient family history includes Colon polyps in her mother; Diabetes in her maternal grandfather; Down syndrome in her daughter; Heart disease in her maternal grandfather; Hyperlipidemia in her mother; Hypertension in her mother; Leukemia in her maternal grandmother; Other in her mother; Restless legs syndrome in her father and mother; Speech  disorder in her daughter; Stroke in her maternal uncle, maternal uncle, and paternal grandfather. ?Social History:  ?Patient  reports that she has never smoked. She has never used smokeless tobacco. She reports that she does not currently use alcohol. She reports that she does not use drugs. ? ?Review of Systems: ?Constitutional: Negative for fever malaise or anorexia ?Cardiovascular: negative for chest pain ?Respiratory: negative for SOB or persistent cough ?Gastrointestinal: negative for abdominal pain ? ?Objective  ?Vitals: BP 120/70   Pulse 76   Temp 98.2 ?F (36.8 ?C)   Ht '5\' 4"'$  (1.626 m)   Wt 123 lb 3.2 oz (55.9 kg)   SpO2 98%   BMI 21.15 kg/m?  ?General: no acute distress , A&Ox3 ?Psych: Normal mood and affect ?Skin:  Warm, no rashes ? ? ?Commons side effects, risks, benefits, and alternatives for medications and treatment plan prescribed today were discussed, and the patient expressed understanding of the given instructions. Patient is instructed to call or message via MyChart if he/she has any questions or concerns regarding our treatment plan. No barriers to understanding were identified. We discussed Red Flag symptoms and signs in detail. Patient expressed understanding regarding what to do in case of urgent or emergency type symptoms.  ?Medication list was reconciled, printed and provided to the patient in AVS. Patient instructions and summary information was reviewed with the patient as documented in the AVS. ?This note was prepared with assistance of Systems analyst. Occasional wrong-word or sound-a-like substitutions may have occurred due to the inherent limitations of voice recognition software ? ? ? ?

## 2021-08-31 ENCOUNTER — Ambulatory Visit (INDEPENDENT_AMBULATORY_CARE_PROVIDER_SITE_OTHER): Payer: 59 | Admitting: Family Medicine

## 2021-08-31 ENCOUNTER — Encounter: Payer: Self-pay | Admitting: Family Medicine

## 2021-08-31 VITALS — BP 100/70 | HR 74 | Temp 98.7°F | Ht 64.0 in | Wt 118.0 lb

## 2021-08-31 DIAGNOSIS — Z136 Encounter for screening for cardiovascular disorders: Secondary | ICD-10-CM

## 2021-08-31 DIAGNOSIS — Z8639 Personal history of other endocrine, nutritional and metabolic disease: Secondary | ICD-10-CM | POA: Diagnosis not present

## 2021-08-31 DIAGNOSIS — Z1231 Encounter for screening mammogram for malignant neoplasm of breast: Secondary | ICD-10-CM | POA: Diagnosis not present

## 2021-08-31 DIAGNOSIS — G2581 Restless legs syndrome: Secondary | ICD-10-CM

## 2021-08-31 DIAGNOSIS — E538 Deficiency of other specified B group vitamins: Secondary | ICD-10-CM | POA: Diagnosis not present

## 2021-08-31 DIAGNOSIS — K581 Irritable bowel syndrome with constipation: Secondary | ICD-10-CM

## 2021-08-31 DIAGNOSIS — R69 Illness, unspecified: Secondary | ICD-10-CM | POA: Diagnosis not present

## 2021-08-31 DIAGNOSIS — Z0000 Encounter for general adult medical examination without abnormal findings: Secondary | ICD-10-CM | POA: Diagnosis not present

## 2021-08-31 DIAGNOSIS — F411 Generalized anxiety disorder: Secondary | ICD-10-CM

## 2021-08-31 LAB — CBC WITH DIFFERENTIAL/PLATELET
Basophils Absolute: 0 10*3/uL (ref 0.0–0.1)
Basophils Relative: 0.7 % (ref 0.0–3.0)
Eosinophils Absolute: 0 10*3/uL (ref 0.0–0.7)
Eosinophils Relative: 0.8 % (ref 0.0–5.0)
HCT: 35.2 % — ABNORMAL LOW (ref 36.0–46.0)
Hemoglobin: 11.5 g/dL — ABNORMAL LOW (ref 12.0–15.0)
Lymphocytes Relative: 41 % (ref 12.0–46.0)
Lymphs Abs: 1.6 10*3/uL (ref 0.7–4.0)
MCHC: 32.6 g/dL (ref 30.0–36.0)
MCV: 81 fl (ref 78.0–100.0)
Monocytes Absolute: 0.3 10*3/uL (ref 0.1–1.0)
Monocytes Relative: 7.6 % (ref 3.0–12.0)
Neutro Abs: 2 10*3/uL (ref 1.4–7.7)
Neutrophils Relative %: 49.9 % (ref 43.0–77.0)
Platelets: 309 10*3/uL (ref 150.0–400.0)
RBC: 4.35 Mil/uL (ref 3.87–5.11)
RDW: 17 % — ABNORMAL HIGH (ref 11.5–15.5)
WBC: 3.9 10*3/uL — ABNORMAL LOW (ref 4.0–10.5)

## 2021-08-31 LAB — COMPREHENSIVE METABOLIC PANEL
ALT: 10 U/L (ref 0–35)
AST: 18 U/L (ref 0–37)
Albumin: 4.4 g/dL (ref 3.5–5.2)
Alkaline Phosphatase: 76 U/L (ref 39–117)
BUN: 7 mg/dL (ref 6–23)
CO2: 27 mEq/L (ref 19–32)
Calcium: 9 mg/dL (ref 8.4–10.5)
Chloride: 104 mEq/L (ref 96–112)
Creatinine, Ser: 0.63 mg/dL (ref 0.40–1.20)
GFR: 110.53 mL/min (ref >60.00)
Glucose, Bld: 97 mg/dL (ref 70–99)
Potassium: 4 mEq/L (ref 3.5–5.1)
Sodium: 139 mEq/L (ref 135–145)
Total Bilirubin: 0.5 mg/dL (ref 0.2–1.2)
Total Protein: 7 g/dL (ref 6.0–8.3)

## 2021-08-31 LAB — LIPID PANEL
Cholesterol: 191 mg/dL (ref 0–200)
HDL: 53.6 mg/dL (ref >39.00)
LDL Cholesterol: 127 mg/dL — ABNORMAL HIGH (ref 0–99)
NonHDL: 137.62
Total CHOL/HDL Ratio: 4
Triglycerides: 52 mg/dL (ref 0.0–149.0)
VLDL: 10.4 mg/dL (ref 0.0–40.0)

## 2021-08-31 LAB — B12 AND FOLATE PANEL
Folate: 4.1 ng/mL — ABNORMAL LOW (ref >5.9)
Vitamin B-12: 153 pg/mL — ABNORMAL LOW (ref 211–911)

## 2021-08-31 LAB — TSH: TSH: 0.71 u[IU]/mL (ref 0.35–5.50)

## 2021-08-31 MED ORDER — PAROXETINE HCL 40 MG PO TABS
40.0000 mg | ORAL_TABLET | Freq: Every day | ORAL | 3 refills | Status: AC
Start: 2021-08-31 — End: ?

## 2021-08-31 NOTE — Patient Instructions (Addendum)
Please return in 6 months to reassess sleep, RLS, and anxiety.   I will release your lab results to you on your MyChart account with further instructions. You may see the results before I do, but when I review them I will send you a message with my report or have my assistant call you if things need to be discussed. Please reply to my message with any questions. Thank you!   Please take all of your medications daily and consistently. Don't stop them without an office visit/discussing with me :)  If you have any questions or concerns, please don't hesitate to send me a message via MyChart or call the office at [phone]. Thank you for visiting with Anne Humphrey today! It's our pleasure caring for you.  I have ordered a mammogram and/or bone density for you as we discussed today: '[x]'$   Mammogram  '[]'$   Bone Density  Please call the office checked below to schedule your appointment:  '[x]'$   [facility]      [place], [place]         '[]'$   [place], [place]  Restless Legs Syndrome Restless legs syndrome is a condition that causes uncomfortable feelings or sensations in the legs, especially while sitting or lying down. The sensations usually cause an overwhelming urge to move the legs. The arms can also sometimes be affected. The condition can range from mild to severe. The symptoms often interfere with a person's ability to sleep. What are the causes? The cause of this condition is not known. What increases the risk? The following factors may make you more likely to develop this condition: Being older than 50. Pregnancy. Being a woman. In general, the condition is more common in women than in men. A family history of the condition. Having iron deficiency. Overuse of caffeine, nicotine, or alcohol. Certain medical conditions, such as kidney disease, Parkinson's disease, or nerve  damage. Certain medicines, such as those for high blood pressure, nausea, colds, allergies, depression, and some heart conditions. What are the signs or symptoms? The main symptom of this condition is uncomfortable sensations in the legs, such as: Pulling. Tingling. Prickling. Throbbing. Crawling. Burning. Usually, the sensations: Affect both sides of the body. Are worse when you sit or lie down. Are worse at night. These may make it difficult to fall asleep. Make you have a strong urge to move your legs. Are temporarily relieved by moving your legs or standing. The arms can also be affected, but this is rare. People who have this condition often have tiredness during the day because of their lack of sleep at night. How is this diagnosed? This condition may be diagnosed based on: Your symptoms. Blood tests. In some cases, you may be monitored in a sleep lab by a specialist (a sleep study). This can detect any disruptions in your sleep. How is this treated? This condition is treated by managing the symptoms. This may include: Lifestyle changes, such as exercising, using relaxation techniques, and avoiding caffeine, alcohol, or tobacco. Iron supplements. Medicines. Parkinson's medications may be tried first. Anti-seizure medications can also be helpful. Follow these instructions at home: General instructions Take over-the-counter and prescription medicines only as told by your health care provider. Use methods to help relieve the uncomfortable sensations, such as: Massaging your legs. Walking or stretching. Taking a  cold or hot bath. Keep all follow-up visits. This is important. Lifestyle     Practice good sleep habits. For example, go to bed and get up at the same time every day. Most adults should get 7-9 hours of sleep each night. Exercise regularly. Try to get at least 30 minutes of exercise most days of the week. Practice ways of relaxing, such as yoga or  meditation. Avoid caffeine and alcohol. Do not use any products that contain nicotine or tobacco. These products include cigarettes, chewing tobacco, and vaping devices, such as e-cigarettes. If you need help quitting, ask your health care provider. Where to find more information [employer] of Neurological Disorders and Stroke: [employer] Contact a health care provider if: Your symptoms get worse or they do not improve with treatment. Summary Restless legs syndrome is a condition that causes uncomfortable feelings or sensations in the legs, especially while sitting or lying down. The symptoms often interfere with your ability to sleep. This condition is treated by managing the symptoms. You may need to make lifestyle changes or take medicines. This information is not intended to replace advice given to you by your health care provider. Make sure you discuss any questions you have with your health care provider. Document Revised: 08/21/2020 Document Reviewed: 08/21/2020 Elsevier Patient Education  [place].

## 2021-08-31 NOTE — Progress Notes (Signed)
Subjective  Chief Complaint  Patient presents with   Annual Exam    Pt here for Annual exam and is not fasting     HPI: Anne Humphrey is a 41 y.o. female who presents to [facility] today for a Female Wellness Visit.  She also has the concerns and/or needs as listed above in the chief complaint. These will be addressed in addition to the Health Maintenance Visit.   Wellness Visit: annual visit with health maintenance review and exam without Pap  HM: eligible for mammogram. Imms are current.  Chronic disease management visit and/or acute problem visit: GAD: see last note.  Patient had self DC'd Paxil.  She restarted it in June.  Now up to 40 mg daily for the last 2 weeks.  Reports it does help with irritability, anxiety, feeling will becoming overwhelmed easily.  However it does worsen restless leg syndrome Complains of chronic symptoms consistent with restless leg.  Never before formally diagnosed.  Reports worsening with SSRIs including Zoloft and Lexapro.  No sleep is difficult until she can fall asleep.  Complains of feeling like she needs to move her legs.  Has history of iron deficiency.  Regular menstrual cycles.  Is not taking supplements. Due for recheck of B12 levels.  Not taking supplements. IBS with constipation: Had thorough GI evaluation last year.  Upper endoscopy and colonoscopy done.  Benign polyps.  Improved with Linzess but she stopped it for unclear reasons.  Now back to having constipation but mild.  No more bloating.  Describes firm hard to pass stools every 2 to 3 days.  No adverse effects from Magna.  Assessment  1. Annual physical exam   2. GAD (generalized anxiety disorder)   3. Irritable bowel syndrome with constipation   4. Vitamin B12 deficiency   5. History of iron deficiency   6. Restless leg syndrome   7. Encounter for screening mammogram for breast cancer      Plan  Female Wellness Visit: Age appropriate Health  Maintenance and Prevention measures were discussed with patient. Included topics are cancer screening recommendations, ways to keep healthy (see AVS) including dietary and exercise recommendations, regular eye and dental care, use of seat belts, and avoidance of moderate alcohol use and tobacco use.  Recommend mammogram, education given BMI: discussed patient's BMI and encouraged positive lifestyle modifications to help get to or maintain a target BMI. HM needs and immunizations were addressed and ordered. See below for orders. See HM and immunization section for updates. Routine labs and screening tests ordered including cmp, cbc and lipids where appropriate. Discussed recommendations regarding Vit D and calcium supplementation (see AVS)  Chronic disease f/u and/or acute problem visit: (deemed necessary to be done in addition to the wellness visit): Generalized anxiety disorder: Much improved on Paxil 40 mg daily.  Counseled on needed keep on this medicine chronically.  Patient agrees IBS with constipation: Recommend restarting Linzess and continuing daily Possible restless leg syndrome: Counseling education done.  Recheck iron levels.  Supplement if needed.  If persistent, would recommend treatment with medications.  Patient will consider Recheck B12 levels.  Likely needs oral supplements.  Follow up: 6 months to recheck mood, RLS and anxiety  Orders Placed This Encounter  Procedures   MM DIGITAL SCREENING BILATERAL   B12 and Folate Panel   CBC with Differential/Platelet   Comprehensive metabolic panel   Lipid panel   TSH   Iron, TIBC and Ferritin Panel  Meds ordered this encounter  Medications   PARoxetine (PAXIL) 40 MG tablet    Sig: Take 1 tablet (40 mg total) by mouth daily.    Dispense:  90 tablet    Refill:  3       Body mass index is 20.25 kg/m. Wt Readings from Last 3 Encounters:  08/31/21 118 lb (53.5 kg)  05/22/21 123 lb 3.2 oz (55.9 kg)  12/26/20 121 lb (54.9 kg)      Patient Active Problem List   Diagnosis Date Noted   GAD (generalized anxiety disorder) 04/26/2020    Priority: High   Attention deficit 04/26/2020    Priority: High    ?ADD vs GAD/PMDD and adjustment reaction; started adderall 10 02/2020 and had a very positive response.    History of gestational diabetes 08/29/2017    Priority: Medium    Allergic asthma 08/29/2017    Priority: Medium    PMDD (premenstrual dysphoric disorder) 08/29/2017    Priority: Medium    Dyshidrotic eczema 04/26/2020    Priority: Low   Seasonal allergic rhinitis 08/29/2017    Priority: Low   Vitamin B12 deficiency 11/21/2020   Irritable bowel syndrome with constipation 08/19/2020    Improved with linzess, Dr. Lorenso Courier, GI eval 10/2020: EGD and colonoscopy, stomach polyp and colon polyp.  Routine CRC screening recommended at 16.     Health Maintenance  Topic Date Due   INFLUENZA VACCINE  08/22/2021   COVID-19 Vaccine (3 - Pfizer series) 09/16/2021 (Originally 05/18/2019)   PAP SMEAR-Modifier  10/11/2022   TETANUS/TDAP  07/11/2024   COLONOSCOPY (Pts 45-84yr Insurance coverage will need to be confirmed)  11/10/2025   Hepatitis C Screening  Completed   HIV Screening  Completed   HPV VACCINES  Aged Out   Immunization History  Administered Date(s) Administered   Influenza, Seasonal, Injecte, Preservative Fre 10/25/2014   Influenza,inj,Quad PF,6+ Mos 10/10/2017   PFIZER(Purple Top)SARS-COV-2 Vaccination 03/02/2019, 03/23/2019   Pneumococcal Polysaccharide-23 08/31/2012   Tdap 06/27/2011, 08/30/2012, 07/12/2014   We updated and reviewed the patient's past history in detail and it is documented below. Allergies: Patient  reports that she does not currently use alcohol. Past Medical History Patient  has a past medical history of Allergic asthma (08/29/2017), Allergy, Anemia, Anxiety, Asthma, Chicken pox, Depression, GERD (gastroesophageal reflux disease), History of frequent urinary tract infections,  History of gestational diabetes (08/29/2017), and IBS (irritable bowel syndrome). Past Surgical History Patient  has a past surgical history that includes Neck mass excision (01/23/1987); Colonoscopy; and Upper gastrointestinal endoscopy. 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  Tobacco Use   Smoking status: Never   Smokeless tobacco: Never  Vaping Use   Vaping Use: Never used  Substance and Sexual Activity   Alcohol use: Not Currently    Comment: rarely   Drug use: No   Sexual activity: Yes    Birth control/protection: Other-see comments    Comment: Husband had a vasectomy  Other Topics Concern   Not on file  Social History Narrative   Not on file   Social Determinants of Health   Financial Resource Strain: Not on file  Food Insecurity: Not on file  Transportation Needs: Not on file  Physical Activity: Not on file  Stress: Not on file  Social Connections: Not on file   Family History  Problem Relation Age of Onset   Restless legs syndrome Mother    Colon polyps Mother    Other Mother        pre-diabetes   Hyperlipidemia Mother    Hypertension Mother    Restless legs syndrome Father    Leukemia Maternal Grandmother    Heart disease Maternal Grandfather    Diabetes Maternal Grandfather    Stroke Paternal Grandfather    Down syndrome Daughter    Speech disorder Daughter    Stroke Maternal Uncle    Stroke Maternal Uncle     Review of Systems: Constitutional: negative for fever or malaise Ophthalmic: negative for photophobia, double vision or loss of vision Cardiovascular: negative for chest pain, dyspnea on exertion, or new LE swelling Respiratory: negative for SOB or persistent cough Gastrointestinal: negative for abdominal pain, change in bowel  habits or melena Genitourinary: negative for dysuria or gross hematuria, no abnormal uterine bleeding or disharge Musculoskeletal: negative for new gait disturbance or muscular weakness Integumentary: negative for new or persistent rashes, no breast lumps Neurological: negative for TIA or stroke symptoms Psychiatric: negative for SI or delusions Allergic/Immunologic: negative for hives  Patient Care Team    Relationship Specialty Notifications Start End  Leamon Arnt, MD PCP - General Family Medicine  08/29/17   Sharyn Creamer, MD Consulting Physician Gastroenterology  10/18/20     Objective  Vitals: BP 100/70   Pulse 74   Temp 98.7 F (37.1 C)   Ht '5\' 4"'$  (1.626 m)   Wt 118 lb (53.5 kg)   SpO2 97%   BMI 20.25 kg/m  General:  Well developed, well nourished, no acute distress  Psych:  Alert and orientedx3,normal mood and affect HEENT:  Normocephalic, atraumatic, non-icteric sclera, PERRL, supple neck without adenopathy, mass or thyromegaly Cardiovascular:  Normal S1, S2, RRR without gallop, rub or murmur Respiratory:  Good breath sounds bilaterally, CTAB with normal respiratory effort Gastrointestinal: normal bowel sounds, soft, non-tender, no noted masses. No HSM MSK: no deformities, contusions. Joints are without erythema or swelling.  Skin:  Warm, no rashes or suspicious lesions noted Neurologic:    Mental status is normal. Gross motor and sensory exams are normal. Normal gait. No tremor    Commons side effects, risks, benefits, and alternatives for medications and treatment plan prescribed today were discussed, and the patient expressed understanding of the given instructions. Patient is instructed to call or message via MyChart if he/she has any questions or concerns regarding our treatment plan. No barriers to understanding were identified. We discussed Red Flag symptoms and signs in detail. Patient expressed understanding regarding what to do in case of urgent or emergency  type symptoms.  Medication list was reconciled, printed and provided to the patient in AVS. Patient instructions and summary information was reviewed with the patient as documented in the AVS. This note was prepared with assistance of Dragon voice recognition software. Occasional wrong-word or sound-a-like substitutions may have occurred due to the inherent limitations of voice recognition software  This visit occurred during the SARS-CoV-2 public health emergency.  Safety protocols were in place, including screening questions prior to the visit, additional usage of staff PPE, and extensive cleaning of exam room while observing appropriate contact time as indicated for disinfecting solutions.

## 2021-09-01 LAB — IRON,TIBC AND FERRITIN PANEL
%SAT: 4 % (calc) — ABNORMAL LOW (ref 16–45)
Ferritin: 4 ng/mL — ABNORMAL LOW (ref 16–154)
Iron: 18 ug/dL — ABNORMAL LOW (ref 40–190)
TIBC: 412 mcg/dL (calc) (ref 250–450)

## 2021-10-16 ENCOUNTER — Encounter: Payer: Self-pay | Admitting: *Deleted

## 2022-01-04 ENCOUNTER — Encounter: Payer: Self-pay | Admitting: *Deleted

## 2022-02-20 IMAGING — DX DG ABDOMEN 1V
1 series · 1 of 1 positions shown · non-contrast
Comparison: None.

CLINICAL DATA: Abdominal pain, bloating, constipation

EXAM:
ABDOMEN - 1 VIEW

[kub ap]
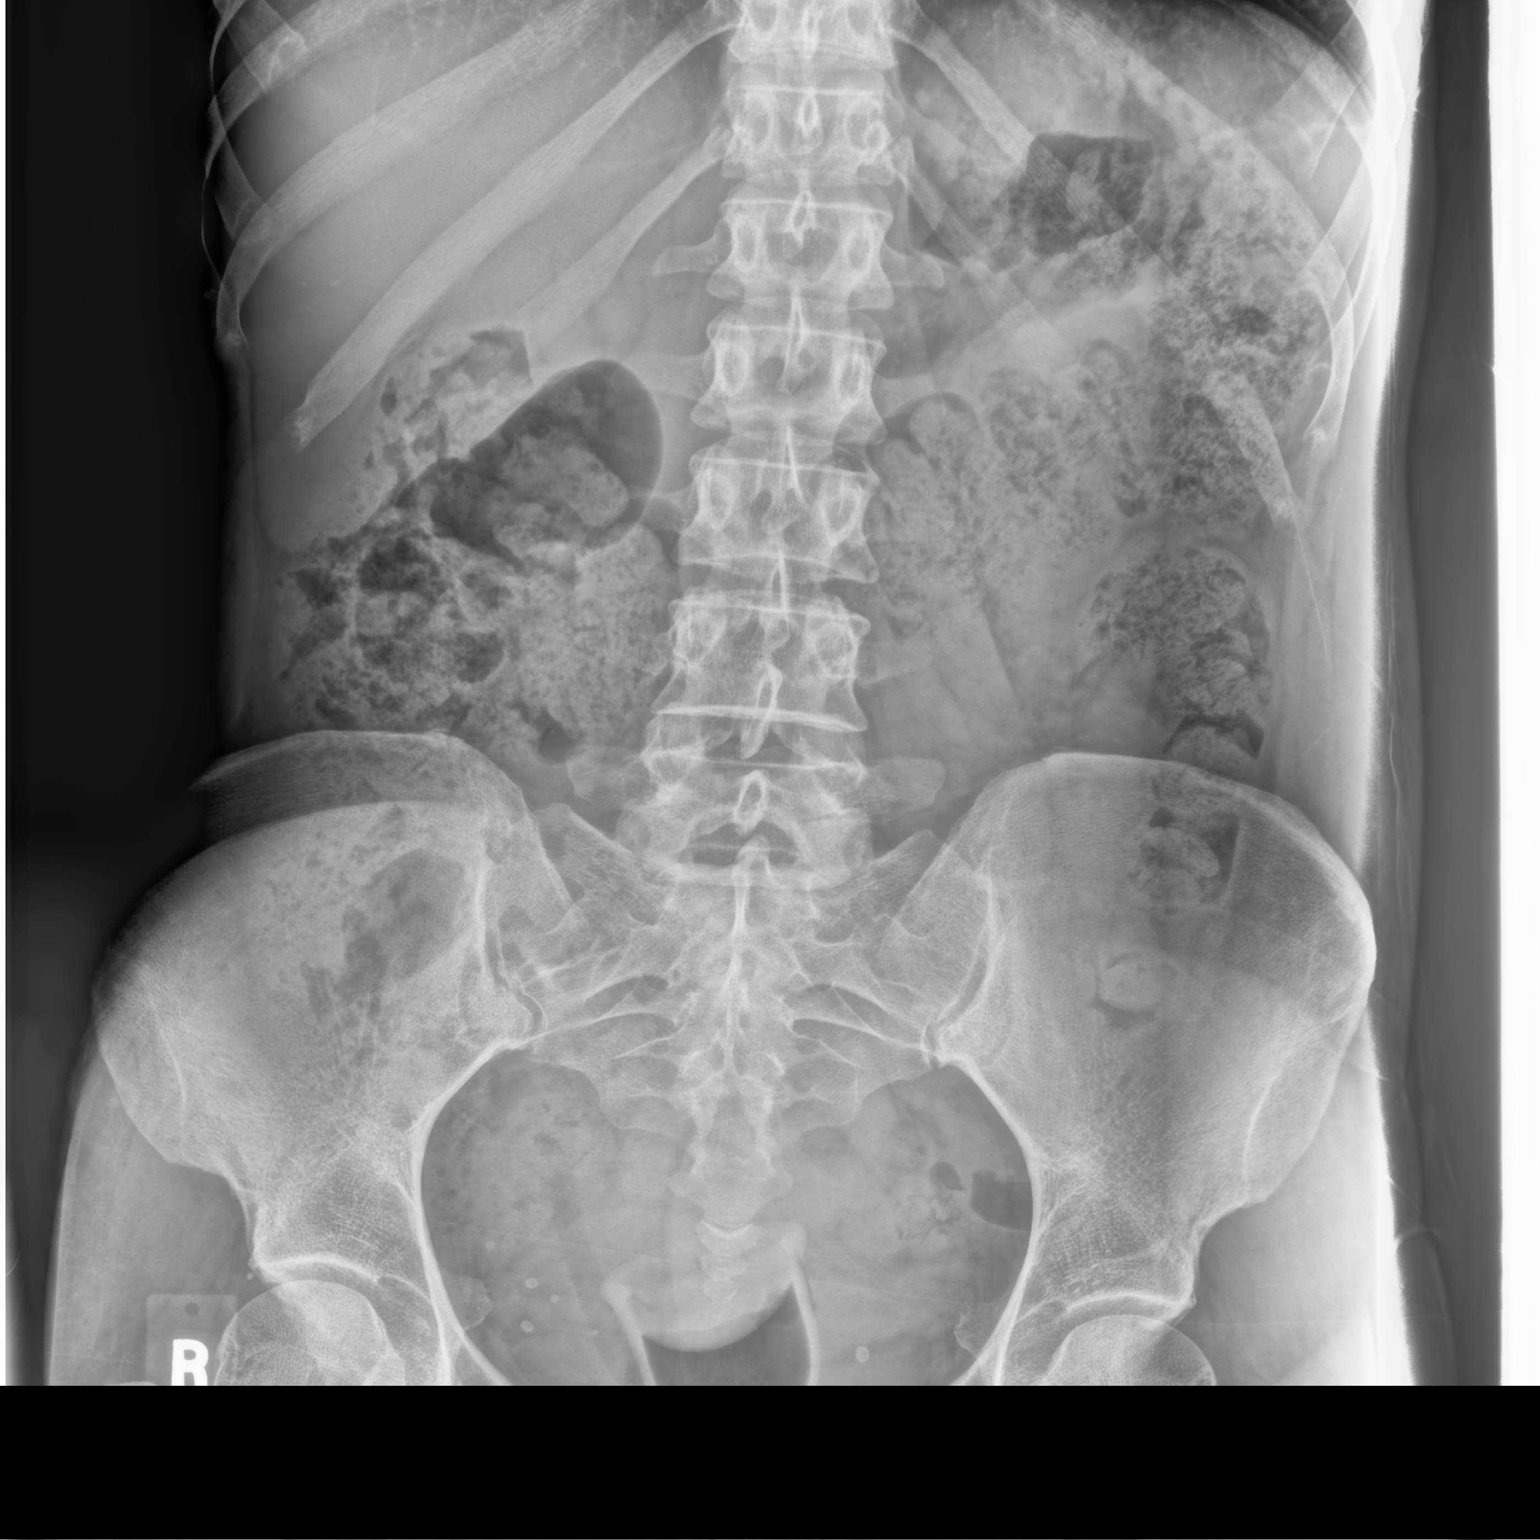

[1 of 1 positions shown; findings below may reference images not displayed]

FINDINGS: Supine frontal view of the abdomen and pelvis was performed. There
is moderate retained stool throughout the colon. No obstruction or
ileus. Radiopaque structure overlying the pelvis most consistent
with menstrual cup. No acute bony abnormalities.
IMPRESSION: 1. Moderate fecal retention.

## 2022-03-07 ENCOUNTER — Ambulatory Visit: Payer: 59 | Admitting: Family Medicine

## 2024-02-05 LAB — OPHTHALMOLOGY REPORT-SCANNED
